# Patient Record
Sex: Female | Born: 1990 | Race: Black or African American | Hispanic: No | Marital: Single | Smoking: Current every day smoker
Health system: Southern US, Community
[De-identification: ages and names within clinical notes are randomized; demographics above are authoritative.]

## PROBLEM LIST (undated history)

## (undated) ENCOUNTER — Inpatient Hospital Stay (HOSPITAL_COMMUNITY): Payer: Self-pay

## (undated) ENCOUNTER — Inpatient Hospital Stay (HOSPITAL_COMMUNITY): Payer: No Typology Code available for payment source

## (undated) DIAGNOSIS — O139 Gestational [pregnancy-induced] hypertension without significant proteinuria, unspecified trimester: Secondary | ICD-10-CM

## (undated) HISTORY — PX: ESOPHAGOGASTRODUODENOSCOPY ENDOSCOPY: SHX5814

## (undated) HISTORY — PX: ABCESS DRAINAGE: SHX399

---

## 2013-03-07 ENCOUNTER — Emergency Department (HOSPITAL_COMMUNITY): Payer: No Typology Code available for payment source

## 2013-03-07 ENCOUNTER — Emergency Department (HOSPITAL_COMMUNITY)
Admission: EM | Admit: 2013-03-07 | Discharge: 2013-03-07 | Disposition: A | Discharge status: Home / Self Care | Payer: No Typology Code available for payment source | Attending: Emergency Medicine | Admitting: Emergency Medicine

## 2013-03-07 DIAGNOSIS — Z79899 Other long term (current) drug therapy: Secondary | ICD-10-CM | POA: Insufficient documentation

## 2013-03-07 DIAGNOSIS — S199XXA Unspecified injury of neck, initial encounter: Secondary | ICD-10-CM | POA: Insufficient documentation

## 2013-03-07 DIAGNOSIS — S0993XA Unspecified injury of face, initial encounter: Secondary | ICD-10-CM | POA: Insufficient documentation

## 2013-03-07 DIAGNOSIS — Y92410 Unspecified street and highway as the place of occurrence of the external cause: Secondary | ICD-10-CM | POA: Insufficient documentation

## 2013-03-07 DIAGNOSIS — Y9389 Activity, other specified: Secondary | ICD-10-CM | POA: Insufficient documentation

## 2013-03-07 DIAGNOSIS — M25519 Pain in unspecified shoulder: Secondary | ICD-10-CM | POA: Insufficient documentation

## 2013-03-07 MED ORDER — CYCLOBENZAPRINE HCL 10 MG PO TABS: 10.0000 mg | ORAL_TABLET | Freq: Two times a day (BID) | ORAL | Status: DC | PRN

## 2013-03-07 MED ORDER — OXYCODONE-ACETAMINOPHEN 5-325 MG PO TABS: 1.0000 | ORAL_TABLET | Freq: Four times a day (QID) | ORAL | Status: DC | PRN

## 2013-03-07 MED ORDER — OXYCODONE-ACETAMINOPHEN 5-325 MG PO TABS: 1.0000 | ORAL_TABLET | Freq: Once | ORAL | Status: DC

## 2013-03-07 MED ORDER — IBUPROFEN 800 MG PO TABS
800.0000 mg | ORAL_TABLET | Freq: Once | ORAL | Status: AC
  Administered 2013-03-07: 800 mg via ORAL
  Filled 2013-03-07: qty 1
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
## 2013-11-01 NOTE — L&D Delivery Note (Signed)
Delivery Note At 4:10 AM a viable female "Anita Strickland" was delivered via Vaginal, Spontaneous Delivery (Presentation: OA, restituting to Right Occiput Anterior).  APGARS: 8, 9; weight pending.   Placenta status: Intact, Spontaneous Pathology.  Cord: 3 vessels with the following complications: None.  Cord pH: NA --- placenta to path due to preeclampsia; thick cord.  Anesthesia: Epidural  Episiotomy: None Lacerations: Vaginal abrasion - hemostatic; no repair required Suture Repair: NA Est. Blood Loss (mL): 350  Mom to postpartum.  Baby to Couplet care / Skin to Skin.  Mom plans to breastfeeding.  Desires Nor-Q-D for contraception.  Couple plans outpatient circumcision.   Sherre ScarletWILLIAMS, Lashelle Koy 10/18/2014, 5:11 AM

## 2013-12-05 ENCOUNTER — Encounter (HOSPITAL_COMMUNITY): Payer: Self-pay | Admitting: Emergency Medicine

## 2013-12-05 ENCOUNTER — Emergency Department (HOSPITAL_COMMUNITY)
Admission: EM | Admit: 2013-12-05 | Discharge: 2013-12-05 | Disposition: A | Discharge status: Home / Self Care | Payer: 59 | Attending: Emergency Medicine | Admitting: Emergency Medicine

## 2013-12-05 DIAGNOSIS — F17200 Nicotine dependence, unspecified, uncomplicated: Secondary | ICD-10-CM | POA: Insufficient documentation

## 2013-12-05 DIAGNOSIS — R1310 Dysphagia, unspecified: Secondary | ICD-10-CM | POA: Insufficient documentation

## 2013-12-05 DIAGNOSIS — R599 Enlarged lymph nodes, unspecified: Secondary | ICD-10-CM | POA: Insufficient documentation

## 2013-12-05 DIAGNOSIS — Z79899 Other long term (current) drug therapy: Secondary | ICD-10-CM | POA: Insufficient documentation

## 2013-12-05 DIAGNOSIS — J029 Acute pharyngitis, unspecified: Secondary | ICD-10-CM | POA: Insufficient documentation

## 2013-12-05 DIAGNOSIS — R059 Cough, unspecified: Secondary | ICD-10-CM | POA: Insufficient documentation

## 2013-12-05 DIAGNOSIS — R05 Cough: Secondary | ICD-10-CM | POA: Insufficient documentation

## 2013-12-05 LAB — RAPID STREP SCREEN (MED CTR MEBANE ONLY): STREPTOCOCCUS, GROUP A SCREEN (DIRECT): NEGATIVE

## 2013-12-05 MED ORDER — IBUPROFEN 600 MG PO TABS: 600.0000 mg | ORAL_TABLET | Freq: Four times a day (QID) | ORAL | Status: DC | PRN

## 2013-12-05 MED ORDER — IBUPROFEN 400 MG PO TABS
600.0000 mg | ORAL_TABLET | Freq: Once | ORAL | Status: AC
  Administered 2013-12-05: 600 mg via ORAL
  Filled 2013-12-05 (×2): qty 1

## 2013-12-05 MED ORDER — DEXAMETHASONE 1 MG/ML PO CONC
10.0000 mg | Freq: Once | ORAL | Status: AC
  Administered 2013-12-05: 10 mg via ORAL
  Filled 2013-12-05: qty 10

## 2013-12-05 NOTE — ED Provider Notes (Signed)
CSN: [id number]     Arrival date & time 12/05/13  0503 History   First MD Initiated Contact with Patient 12/05/13 [phone]     Chief Complaint  Patient presents with  . Sore Throat   (Consider location/radiation/quality/duration/timing/severity/associated sxs/prior Treatment) HPI Comments: Pt comes in with cc of sore throat and occasional cough - non productive. Sx started on sunday. She has dysphagia and odynophagia. There is no n/v/f/c. Pt has no weakness. Decreased po intake due to the sore throat.  Patient is a 23 y.o. female presenting with pharyngitis. The history is provided by the patient.  Sore Throat Pertinent negatives include no chest pain and no shortness of breath.    History reviewed. No pertinent past medical history. History reviewed. No pertinent past surgical history. No family history on file. History  Substance Use Topics  . Smoking status: Current Every Day Smoker  . Smokeless tobacco: Not on file  . Alcohol Use: Yes   OB History   Grav Para Term Preterm Abortions TAB SAB Ect Mult Living                 Review of Systems  Constitutional: Negative for fever.  HENT: Positive for sore throat and trouble swallowing. Negative for drooling and voice change.   Respiratory: Positive for cough. Negative for shortness of breath, wheezing and stridor.   Cardiovascular: Negative for chest pain.    Allergies  Review of patient's allergies indicates no known allergies.  Home Medications   Current Outpatient Rx  Name  Route  Sig  Dispense  Refill  . Aspirin-Acetaminophen-Caffeine (GOODY HEADACHE PO)   Oral   Take 1 packet by mouth once.         . norethindrone-ethinyl estradiol (JUNEL FE,GILDESS FE,LOESTRIN FE) 1-20 MG-MCG tablet   Oral   Take 1 tablet by mouth daily.         . Pseudoephedrine-APAP-DM 30-325-15 MG CAPS   Oral   Take by mouth.         . Pseudoephedrine-APAP-DM 84-696-2930-325-15 MG/15ML LIQD   Oral   Take 15 mLs by mouth once.         Marland Kitchen.  ibuprofen (ADVIL,MOTRIN) 600 MG tablet   Oral   Take 1 tablet (600 mg total) by mouth every 6 (six) hours as needed.   30 tablet   0    BP 121/72  Pulse 110  Temp(Src) 99.1 F (37.3 C) (Oral)  Resp 16  SpO2 98%  LMP 12/02/2013 Physical Exam  Nursing note and vitals reviewed. Constitutional: She is oriented to person, place, and time. She appears well-developed and well-nourished.  HENT:  Head: Normocephalic and atraumatic.  Mouth/Throat: Oropharyngeal exudate present.  Right tonsillar exudates  Eyes: EOM are normal. Pupils are equal, round, and reactive to light.  Neck: Neck supple.  Cardiovascular: Normal rate, regular rhythm and normal heart sounds.   No murmur heard. Pulmonary/Chest: Effort normal. No stridor. No respiratory distress.  Abdominal: Soft.  Lymphadenopathy:    She has cervical adenopathy.  Neurological: She is alert and oriented to person, place, and time.  Skin: Skin is warm and dry.    ED Course  Procedures (including critical care time) Labs Review Labs Reviewed  RAPID STREP SCREEN  CULTURE, GROUP A STREP   Imaging Review No results found.  EKG Interpretation   None       MDM   1. Pharyngitis    Pt with sore throat and exudative changes on exam. Centor score is 2. Rapid  strep is neg - culture sent. Will treat as viral pharyngitis. Based on exam - no red flags for deep throat infections.  Derwood Kaplan, MD 12/05/13 [phone]

## 2013-12-05 NOTE — ED Notes (Signed)
Pt. reports sore throat with occasional dry cough onset Sunday , airway intact / respirations unlabored , denies fever .

## 2013-12-05 NOTE — Discharge Instructions (Signed)
Viral Pharyngitis Viral pharyngitis is a viral infection that produces redness, pain, and swelling (inflammation) of the throat. It can spread from person to person (contagious). CAUSES Viral pharyngitis is caused by inhaling a large amount of certain germs called viruses. Many different viruses cause viral pharyngitis. SYMPTOMS Symptoms of viral pharyngitis include:  Sore throat.  Tiredness.  Stuffy nose.  Low-grade fever.  Congestion.  Cough. TREATMENT Treatment includes rest, drinking plenty of fluids, and the use of over-the-counter medication (approved by your caregiver). HOME CARE INSTRUCTIONS   Drink enough fluids to keep your urine clear or pale yellow.  Eat soft, cold foods such as ice cream, frozen ice pops, or gelatin dessert.  Gargle with warm salt water (1 tsp salt per 1 qt of water).  If over age 7, throat lozenges may be used safely.  Only take over-the-counter or prescription medicines for pain, discomfort, or fever as directed by your caregiver. Do not take aspirin. To help prevent spreading viral pharyngitis to others, avoid:  Mouth-to-mouth contact with others.  Sharing utensils for eating and drinking.  Coughing around others. SEEK MEDICAL CARE IF:   You are better in a few days, then become worse.  You have a fever or pain not helped by pain medicines.  There are any other changes that concern you. Document Released: 07/28/2005 Document Revised: 01/10/2012 Document Reviewed: 12/24/2010 Exit[employer] Patient Information 2014 [employer].  

## 2013-12-07 LAB — CULTURE, GROUP A STREP

## 2014-02-26 ENCOUNTER — Other Ambulatory Visit (HOSPITAL_COMMUNITY)
Admission: RE | Admit: 2014-02-26 | Discharge: 2014-02-26 | Disposition: A | Discharge status: Home / Self Care | Payer: Medicaid Other | Source: Ambulatory Visit | Attending: Emergency Medicine | Admitting: Emergency Medicine

## 2014-02-26 ENCOUNTER — Encounter (HOSPITAL_COMMUNITY): Payer: Self-pay | Admitting: Emergency Medicine

## 2014-02-26 ENCOUNTER — Emergency Department (INDEPENDENT_AMBULATORY_CARE_PROVIDER_SITE_OTHER)
Admission: EM | Admit: 2014-02-26 | Discharge: 2014-02-26 | Disposition: A | Discharge status: Home / Self Care | Payer: BC Managed Care – PPO | Source: Home / Self Care | Attending: Family Medicine | Admitting: Family Medicine

## 2014-02-26 DIAGNOSIS — N760 Acute vaginitis: Secondary | ICD-10-CM | POA: Insufficient documentation

## 2014-02-26 DIAGNOSIS — Z3490 Encounter for supervision of normal pregnancy, unspecified, unspecified trimester: Secondary | ICD-10-CM

## 2014-02-26 DIAGNOSIS — Z3201 Encounter for pregnancy test, result positive: Secondary | ICD-10-CM

## 2014-02-26 DIAGNOSIS — Z113 Encounter for screening for infections with a predominantly sexual mode of transmission: Secondary | ICD-10-CM | POA: Insufficient documentation

## 2014-02-26 LAB — POCT URINALYSIS DIP (DEVICE)
BILIRUBIN URINE: NEGATIVE
Glucose, UA: NEGATIVE mg/dL
Hgb urine dipstick: NEGATIVE
KETONES UR: NEGATIVE mg/dL
Leukocytes, UA: NEGATIVE
Nitrite: NEGATIVE
PH: 7.5 (ref 5.0–8.0)
PROTEIN: NEGATIVE mg/dL
SPECIFIC GRAVITY, URINE: 1.02 (ref 1.005–1.030)
Urobilinogen, UA: 0.2 mg/dL (ref 0.0–1.0)

## 2014-02-26 LAB — POCT PREGNANCY, URINE: Preg Test, Ur: POSITIVE — AB

## 2014-02-26 MED ORDER — PRENATAL COMPLETE 14-0.4 MG PO TABS: ORAL_TABLET | ORAL | Status: DC

## 2014-02-26 NOTE — ED Notes (Signed)
Pt here for pregnancy testing.  Unsure of last LMP.   States "I had three positive home pregnancy test".  Pt voices no other concerns at this time.

## 2014-02-26 NOTE — Discharge Instructions (Signed)

## 2014-02-26 NOTE — ED Provider Notes (Signed)
CSN: [id number]     Arrival date & time 02/26/14  1116 History   First MD Initiated Contact with Patient 02/26/14 1219     Chief Complaint  Patient presents with  . Possible Pregnancy   (Consider location/radiation/quality/duration/timing/severity/associated sxs/prior Treatment) Patient is a 23 y.o. female presenting with vaginal discharge. The history is provided by the patient. No language interpreter was used.  Vaginal Discharge Quality:  White Severity:  Mild Onset quality:  Gradual Timing:  Constant Progression:  Worsening Chronicity:  New Relieved by:  Nothing Worsened by:  Nothing tried Ineffective treatments:  None tried Associated symptoms: no nausea     History reviewed. No pertinent past medical history. History reviewed. No pertinent past surgical history. History reviewed. No pertinent family history. History  Substance Use Topics  . Smoking status: Current Every Day Smoker  . Smokeless tobacco: Not on file  . Alcohol Use: Yes   OB History   Grav Para Term Preterm Abortions TAB SAB Ect Mult Living   1              Review of Systems  Gastrointestinal: Negative for nausea.  Genitourinary: Positive for vaginal discharge.  All other systems reviewed and are negative.   Allergies  Review of patient's allergies indicates no known allergies.  Home Medications   Prior to Admission medications   Medication Sig Start Date End Date Taking? Authorizing Provider  ibuprofen (ADVIL,MOTRIN) 600 MG tablet Take 1 tablet (600 mg total) by mouth every 6 (six) hours as needed. 12/05/13  Yes Derwood KaplanAnkit Nanavati, MD  Aspirin-Acetaminophen-Caffeine (GOODY HEADACHE PO) Take 1 packet by mouth once.    Historical Provider, MD  norethindrone-ethinyl estradiol (JUNEL FE,GILDESS FE,LOESTRIN FE) 1-20 MG-MCG tablet Take 1 tablet by mouth daily.    Historical Provider, MD  Pseudoephedrine-APAP-DM 82-956-2130-325-15 MG CAPS Take by mouth.    Historical Provider, MD  Pseudoephedrine-APAP-DM 30-865-7830-325-15  MG/15ML LIQD Take 15 mLs by mouth once.    Historical Provider, MD   BP 120/74  Pulse 64  Temp(Src) 98.4 F (36.9 C) (Oral)  Resp 14  SpO2 100%  LMP 12/02/2013 Physical Exam  Nursing note and vitals reviewed. Constitutional: She appears well-developed and well-nourished.  HENT:  Head: Normocephalic and atraumatic.  Right Ear: External ear normal.  Left Ear: External ear normal.  Eyes: Pupils are equal, round, and reactive to light.  Neck: Normal range of motion.  Cardiovascular: Normal rate.   Pulmonary/Chest: Effort normal and breath sounds normal.  Abdominal: Soft.  Genitourinary: Vaginal discharge found.  Musculoskeletal: Normal range of motion.  Neurological: She is alert.  Skin: Skin is warm.  Psychiatric: She has a normal mood and affect.    ED Course  Procedures (including critical care time) Labs Review Labs Reviewed - No data to display  Imaging Review No results found.   MDM   1. Pregnancy    Affirm, gc and ct pending    Elson AreasLeslie K Viviann Broyles, PA-C 02/26/14 1748

## 2014-02-27 LAB — CERVICOVAGINAL ANCILLARY ONLY
Chlamydia: NEGATIVE
Neisseria Gonorrhea: NEGATIVE
WET PREP (BD AFFIRM): NEGATIVE
Wet Prep (BD Affirm): NEGATIVE
Wet Prep (BD Affirm): POSITIVE — AB

## 2014-02-27 NOTE — ED Provider Notes (Signed)
Medical screening examination/treatment/procedure(s) were performed by resident physician or non-physician practitioner and as supervising physician I was immediately available for consultation/collaboration.   Dillen Belmontes DOUGLAS MD.   Yeimy Brabant D Evangeline Utley, MD 02/27/14 1705 

## 2014-03-01 ENCOUNTER — Telehealth (HOSPITAL_COMMUNITY): Payer: Self-pay | Admitting: *Deleted

## 2014-03-01 LAB — OB RESULTS CONSOLE GC/CHLAMYDIA
CHLAMYDIA, DNA PROBE: NEGATIVE
Gonorrhea: NEGATIVE

## 2014-03-01 LAB — OB RESULTS CONSOLE HEPATITIS B SURFACE ANTIGEN: HEP B S AG: NEGATIVE

## 2014-03-01 LAB — OB RESULTS CONSOLE HIV ANTIBODY (ROUTINE TESTING): HIV: NONREACTIVE

## 2014-03-01 LAB — OB RESULTS CONSOLE RPR: RPR: NONREACTIVE

## 2014-03-01 LAB — OB RESULTS CONSOLE ABO/RH: RH Type: POSITIVE

## 2014-03-01 LAB — OB RESULTS CONSOLE RUBELLA ANTIBODY, IGM: Rubella: IMMUNE

## 2014-03-01 LAB — OB RESULTS CONSOLE ANTIBODY SCREEN: ANTIBODY SCREEN: NEGATIVE

## 2014-03-01 NOTE — ED Notes (Signed)
GC/Chlamydia neg., Affirm: Candida and Trich neg., Gardnerella pos.  4/30 Message sent to NCR CorporationKaren Sofia PA.  She ordered Flagyl 500 mg. Po BID # 14.  5/1 I called pt. Pt. verified x 2 and given results.  Pt. told she needs Flagyl for bacterial vaginosis.  Pt. asked if it is safe to take in pregnancy.  I told her she can check with OB-GYN before taking, but I have been told by the physicians here that it is OK to take during pregnancy.  Pt. wants Rx. called to [facility].  Rx. Called to pharmacy voicemail @ [phone]. Desiree LucySuzanne M Eiley Mcginnity 03/01/2014

## 2014-08-25 ENCOUNTER — Encounter (HOSPITAL_COMMUNITY): Payer: Self-pay | Admitting: *Deleted

## 2014-08-25 ENCOUNTER — Inpatient Hospital Stay (HOSPITAL_COMMUNITY)
Admission: AD | Admit: 2014-08-25 | Discharge: 2014-08-25 | Disposition: A | Discharge status: Home / Self Care | Payer: Medicaid Other | Source: Ambulatory Visit | Attending: Obstetrics & Gynecology | Admitting: Obstetrics & Gynecology

## 2014-08-25 DIAGNOSIS — Z720 Tobacco use: Secondary | ICD-10-CM | POA: Insufficient documentation

## 2014-08-25 DIAGNOSIS — O0932 Supervision of pregnancy with insufficient antenatal care, second trimester: Secondary | ICD-10-CM

## 2014-08-25 DIAGNOSIS — N898 Other specified noninflammatory disorders of vagina: Secondary | ICD-10-CM | POA: Diagnosis not present

## 2014-08-25 DIAGNOSIS — O26893 Other specified pregnancy related conditions, third trimester: Secondary | ICD-10-CM | POA: Insufficient documentation

## 2014-08-25 DIAGNOSIS — Z3A29 29 weeks gestation of pregnancy: Secondary | ICD-10-CM | POA: Diagnosis not present

## 2014-08-25 DIAGNOSIS — R109 Unspecified abdominal pain: Secondary | ICD-10-CM | POA: Diagnosis not present

## 2014-08-25 LAB — WET PREP, GENITAL
CLUE CELLS WET PREP: NONE SEEN
Trich, Wet Prep: NONE SEEN
YEAST WET PREP: NONE SEEN

## 2014-08-25 LAB — OB RESULTS CONSOLE ABO/RH: RH TYPE: POSITIVE

## 2014-08-25 LAB — URINALYSIS, ROUTINE W REFLEX MICROSCOPIC
Bilirubin Urine: NEGATIVE
GLUCOSE, UA: NEGATIVE mg/dL
Hgb urine dipstick: NEGATIVE
KETONES UR: NEGATIVE mg/dL
LEUKOCYTES UA: NEGATIVE
NITRITE: NEGATIVE
PH: 7 (ref 5.0–8.0)
PROTEIN: NEGATIVE mg/dL
Specific Gravity, Urine: 1.01 (ref 1.005–1.030)
Urobilinogen, UA: 0.2 mg/dL (ref 0.0–1.0)

## 2014-08-25 MED ORDER — FLUCONAZOLE 150 MG PO TABS: 150.0000 mg | ORAL_TABLET | Freq: Once | ORAL | Status: DC

## 2014-08-25 NOTE — Treatment Plan (Signed)
[facility].  Pt is O+.

## 2014-08-25 NOTE — MAU Provider Note (Signed)
Chief Complaint:  Abdominal Pain   Anita Strickland is a 23 y.o.  G2P0010 with IUP w limited pnc presenting for Abdominal Pain . She has not been seen by a doctor since 07/10/2014. Patient states she has been having no contractions, no vaginal bleeding, intact membranes, with active fetal movement.   She states that abdominal pain is like cramping, irregular, and has been happening for the past few days. She states that she rarely drinks water. Last intercourse was last night, denies dyspareunia. C/o vaginal discharge for the past week that at first was thin and white but now is thicker and pruritic.  Menstrual History: OB History   Grav Para Term Preterm Abortions TAB SAB Ect Mult Living   2 0 0 0 1 0 1 0 0 0      Patient's last menstrual period was 12/02/2013.      Past Medical History  Diagnosis Date  . Medical history non-contributory     Past Surgical History  Procedure Laterality Date  . Abcess drainage      leg    No family history on file.  History  Substance Use Topics  . Smoking status: Current Every Day Smoker    Types: Cigarettes  . Smokeless tobacco: Never Used  . Alcohol Use: Yes     No Known Allergies  Prescriptions prior to admission  Medication Sig Dispense Refill  . Aspirin-Acetaminophen-Caffeine (GOODY HEADACHE PO) Take 1 packet by mouth once.      Marland Kitchen. ibuprofen (ADVIL,MOTRIN) 600 MG tablet Take 1 tablet (600 mg total) by mouth every 6 (six) hours as needed.  30 tablet  0  . norethindrone-ethinyl estradiol (JUNEL FE,GILDESS FE,LOESTRIN FE) 1-20 MG-MCG tablet Take 1 tablet by mouth daily.      . Prenatal Vit-Fe Fumarate-FA (PRENATAL COMPLETE) 14-0.4 MG TABS One a day.  (may substitute, brand or dosage)  60 each  1  . Pseudoephedrine-APAP-DM 30-325-15 MG CAPS Take by mouth.      . Pseudoephedrine-APAP-DM 30-325-15 MG/15ML LIQD Take 15 mLs by mouth once.        Review of Systems - Negative except for what is mentioned in HPI.  Physical Exam  Blood  pressure 119/73, pulse 83, temperature 98.5 F (36.9 C), temperature source Oral, resp. rate 18, height 5' (1.524 m), weight 78.019 kg (172 lb), last menstrual period 12/02/2013. GENERAL: Well-developed, well-nourished female in no acute distress.  LUNGS: Clear to auscultation bilaterally.  HEART: Regular rate and rhythm. ABDOMEN: Soft, nontender, nondistended, gravid.  EXTREMITIES: Nontender, no edema, 2+ distal pulses.   Cervical Exam: Closed/thick/high FHT:  Baseline rate 140 bpm   Variability moderate  Accelerations present   Decelerations none Slight uterine excitabilty   Labs: Results for orders placed during the hospital encounter of 08/25/14 (from the past 24 hour(s))  URINALYSIS, ROUTINE W REFLEX MICROSCOPIC   Collection Time    08/25/14  2:15 PM      Result Value Ref Range   Color, Urine YELLOW  YELLOW   APPearance CLEAR  CLEAR   Specific Gravity, Urine 1.010  1.005 - 1.030   pH 7.0  5.0 - 8.0   Glucose, UA NEGATIVE  NEGATIVE mg/dL   Hgb urine dipstick NEGATIVE  NEGATIVE   Bilirubin Urine NEGATIVE  NEGATIVE   Ketones, ur NEGATIVE  NEGATIVE mg/dL   Protein, ur NEGATIVE  NEGATIVE mg/dL   Urobilinogen, UA 0.2  0.0 - 1.0 mg/dL   Nitrite NEGATIVE  NEGATIVE   Leukocytes, UA NEGATIVE  NEGATIVE  Imaging Studies:  No results found.  Assessment: Anita Strickland is  23 y.o. G2P0010 at Unknown presents with Abdominal Pain .  Plan:  #Gestation of [id number] w limited PNC --No signs of active/imminent labor. --Pt has very limited PNC; will send notification to [facility] to make appt and contact her --Pt is O positive; no indication for Rho gam at this time  #Vaginal discharge --Most likely candidal --Diflucan dose today; will prescribe 2 more tablets to take q3d as needed for symptoms  #FWB --NST reactive. Category I. --Increased uterine tonicity. Explained to pt importance of hydration during pregnancy --No current indication for tocolysis  Aldona BarKoch, Anessia Oakland L 10/25/20153:27  PM
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
## 2014-08-25 NOTE — MAU Provider Note (Signed)
Attestation of Attending Supervision of Resident: Evaluation and management procedures were performed by the Family Medicine Resident under my supervision.  I have seen and examined the patient, reviewed the resident's note and chart, and I agree with the management and plan.  Ameyah Bangura, MD, FACOG Attending Obstetrician & Gynecologist Faculty Practice, [facility]    

## 2014-08-26 LAB — GC/CHLAMYDIA PROBE AMP
CT Probe RNA: NEGATIVE
GC Probe RNA: NEGATIVE

## 2014-09-02 ENCOUNTER — Encounter (HOSPITAL_COMMUNITY): Payer: Self-pay | Admitting: *Deleted

## 2014-10-08 LAB — OB RESULTS CONSOLE GBS: STREP GROUP B AG: POSITIVE

## 2014-10-09 ENCOUNTER — Encounter (HOSPITAL_COMMUNITY): Payer: Self-pay | Admitting: *Deleted

## 2014-10-09 ENCOUNTER — Encounter: Payer: Self-pay | Admitting: Advanced Practice Midwife

## 2014-10-09 ENCOUNTER — Inpatient Hospital Stay (HOSPITAL_COMMUNITY)
Admission: AD | Admit: 2014-10-09 | Discharge: 2014-10-09 | Disposition: A | Discharge status: Home / Self Care | Payer: Medicaid Other | Source: Ambulatory Visit | Attending: Obstetrics & Gynecology | Admitting: Obstetrics & Gynecology

## 2014-10-09 ENCOUNTER — Encounter: Payer: Medicaid Other | Admitting: Advanced Practice Midwife

## 2014-10-09 ENCOUNTER — Ambulatory Visit: Payer: Medicaid Other | Admitting: Family Medicine

## 2014-10-09 DIAGNOSIS — I159 Secondary hypertension, unspecified: Secondary | ICD-10-CM

## 2014-10-09 LAB — URINALYSIS, ROUTINE W REFLEX MICROSCOPIC
BILIRUBIN URINE: NEGATIVE
Glucose, UA: NEGATIVE mg/dL
Hgb urine dipstick: NEGATIVE
Ketones, ur: NEGATIVE mg/dL
Leukocytes, UA: NEGATIVE
NITRITE: NEGATIVE
PH: 7 (ref 5.0–8.0)
Protein, ur: 100 mg/dL — AB
SPECIFIC GRAVITY, URINE: 1.025 (ref 1.005–1.030)
UROBILINOGEN UA: 1 mg/dL (ref 0.0–1.0)

## 2014-10-09 LAB — URINE MICROSCOPIC-ADD ON

## 2014-10-09 NOTE — MAU Note (Signed)
Pt was called my MD office & told to come to hospital for preeclampsia.  BP was up at office yesterday.  Has slight HA today, denies visual changes or epigastric pain.  Denies uc's, bleeding or LOF.

## 2014-10-09 NOTE — MAU Provider Note (Signed)
MAU Addendum Note  Results for orders placed or performed during the hospital encounter of 10/09/14 (from the past 24 hour(s))  Urinalysis, Routine w reflex microscopic     Status: Abnormal   Collection Time: 10/09/14  1:12 PM  Result Value Ref Range   Color, Urine YELLOW YELLOW   APPearance CLEAR CLEAR   Specific Gravity, Urine 1.025 1.005 - 1.030   pH 7.0 5.0 - 8.0   Glucose, UA NEGATIVE NEGATIVE mg/dL   Hgb urine dipstick NEGATIVE NEGATIVE   Bilirubin Urine NEGATIVE NEGATIVE   Ketones, ur NEGATIVE NEGATIVE mg/dL   Protein, ur 161100 (A) NEGATIVE mg/dL   Urobilinogen, UA 1.0 0.0 - 1.0 mg/dL   Nitrite NEGATIVE NEGATIVE   Leukocytes, UA NEGATIVE NEGATIVE  Urine microscopic-add on     Status: Abnormal   Collection Time: 10/09/14  1:12 PM  Result Value Ref Range   Squamous Epithelial / LPF FEW (A) RARE   WBC, UA 3-6 <3 WBC/hpf   RBC / HPF 0-2 <3 RBC/hpf   Bacteria, UA MANY (A) RARE   Urine-Other MUCOUS PRESENT    Filed Vitals:   10/09/14 1307 10/09/14 1317 10/09/14 1327 10/09/14 1337  BP: 148/102 155/107 145/106 147/103  Pulse: 70 81 74 80  Temp:      TempSrc:      Resp:       Pt remains asymptomatic  FHT 130, + accel, no decel, moderate variability Ctx q2-8    Plan: -monitor BP for 1 more  Hours then DC to home if BPs are not severe (>160/110) -Discussed need to follow up in office on Monday, pt to make an appointment -Bleeding and labor Precautions -Encouraged to call if any questions or concerns arise prior to next scheduled office visit.  -Discharged to home in stable condition -IOL at 37.0 weeks, pt will be call with time -Consulted with Dr. Burna FortsKulwa   Jaunita Mikels, CNM, MSN 10/09/2014. 2:39 PM

## 2014-10-09 NOTE — MAU Provider Note (Signed)
Anita HookBreanna Strickland is a 23 y.o. G2P0010 at 36.0 weeks came from the office d/t concerns of elevated BP.  Here for serial BP.  As per Dr Sallye OberKulwa monitor for severe BP and s/s of pre x.     History     There are no active problems to display for this patient.   Chief Complaint  Patient presents with  . Hypertension   HPI  OB History    Gravida Para Term Preterm AB TAB SAB Ectopic Multiple Living   2 0 0 0 1 0 1 0 0 0       Past Medical History  Diagnosis Date  . Medical history non-contributory     Past Surgical History  Procedure Laterality Date  . Abcess drainage      leg    History reviewed. No pertinent family history.  History  Substance Use Topics  . Smoking status: Current Every Day Smoker -- 0.25 packs/day    Types: Cigarettes  . Smokeless tobacco: Never Used  . Alcohol Use: Yes    Allergies: No Known Allergies  Prescriptions prior to admission  Medication Sig Dispense Refill Last Dose  . omeprazole (PRILOSEC OTC) 20 MG tablet Take 20 mg by mouth daily as needed (for heartburn).    Past Week at Unknown time  . Prenatal Vit-Fe Fumarate-FA (PRENATAL MULTIVITAMIN) TABS tablet Take 1 tablet by mouth daily.   Past Week at Unknown time  . acetaminophen (TYLENOL) 500 MG tablet Take 1,000 mg by mouth every 6 (six) hours as needed for headache.   prn  . fluconazole (DIFLUCAN) 150 MG tablet Take 1 tablet (150 mg total) by mouth once. (Patient not taking: Reported on 10/09/2014) 3 tablet 0     ROS See HPI above, all other systems are negative  Physical Exam   Blood pressure 147/105, pulse 80, temperature 98.1 F (36.7 C), temperature source Oral, resp. rate 18, last menstrual period 12/02/2013.  Physical Exam Ext:  WNL ABD: Soft, non tender to palpation, no rebound or guarding SVE: deferred   ED Course  Assessment: IUP at  36.0 weeks Membranes: intact FHR: Category 1, FHR 130, 10 x 10', moderate variability, no decel, CTX:  Occasional, not felt by  pt   Plan: Serial BP x 2 hours, monitor for severely elevated BP and s/s of pre x OK to Dc if no severe features Consult with Dr. Burna Fortskulwa   Geraldine Sandberg, CNM, MSN 10/09/2014. 12:58 PM

## 2014-10-09 NOTE — Discharge Instructions (Signed)

## 2014-10-09 NOTE — Progress Notes (Signed)
Anita BakeV. Standard, CNM reviewed BP's and tracing. Patient given written handout on preeclampsia with D/C instrs.

## 2014-10-10 ENCOUNTER — Encounter (HOSPITAL_COMMUNITY): Payer: Self-pay | Admitting: *Deleted

## 2014-10-10 ENCOUNTER — Observation Stay (HOSPITAL_COMMUNITY)
Admission: AD | Admit: 2014-10-10 | Discharge: 2014-10-11 | Disposition: A | Discharge status: Home / Self Care | Payer: Medicaid Other | Source: Ambulatory Visit | Attending: Obstetrics and Gynecology | Admitting: Obstetrics and Gynecology

## 2014-10-10 DIAGNOSIS — F17200 Nicotine dependence, unspecified, uncomplicated: Secondary | ICD-10-CM

## 2014-10-10 DIAGNOSIS — O1493 Unspecified pre-eclampsia, third trimester: Secondary | ICD-10-CM | POA: Diagnosis present

## 2014-10-10 DIAGNOSIS — O99333 Smoking (tobacco) complicating pregnancy, third trimester: Secondary | ICD-10-CM | POA: Diagnosis not present

## 2014-10-10 DIAGNOSIS — B951 Streptococcus, group B, as the cause of diseases classified elsewhere: Secondary | ICD-10-CM

## 2014-10-10 DIAGNOSIS — Z3A36 36 weeks gestation of pregnancy: Secondary | ICD-10-CM | POA: Diagnosis not present

## 2014-10-10 LAB — CBC
HCT: 37.8 % (ref 36.0–46.0)
Hemoglobin: 13.1 g/dL (ref 12.0–15.0)
MCH: 29.8 pg (ref 26.0–34.0)
MCHC: 34.7 g/dL (ref 30.0–36.0)
MCV: 85.9 fL (ref 78.0–100.0)
PLATELETS: 259 10*3/uL (ref 150–400)
RBC: 4.4 MIL/uL (ref 3.87–5.11)
RDW: 14.8 % (ref 11.5–15.5)
WBC: 8.9 10*3/uL (ref 4.0–10.5)

## 2014-10-10 LAB — COMPREHENSIVE METABOLIC PANEL
ALT: 8 U/L (ref 0–35)
AST: 12 U/L (ref 0–37)
Albumin: 2.5 g/dL — ABNORMAL LOW (ref 3.5–5.2)
Alkaline Phosphatase: 181 U/L — ABNORMAL HIGH (ref 39–117)
Anion gap: 13 (ref 5–15)
BILIRUBIN TOTAL: 0.4 mg/dL (ref 0.3–1.2)
BUN: 5 mg/dL — ABNORMAL LOW (ref 6–23)
CHLORIDE: 101 meq/L (ref 96–112)
CO2: 21 meq/L (ref 19–32)
Calcium: 9.2 mg/dL (ref 8.4–10.5)
Creatinine, Ser: 0.71 mg/dL (ref 0.50–1.10)
GFR calc Af Amer: >90 mL/min (ref >90)
Glucose, Bld: 120 mg/dL — ABNORMAL HIGH (ref 70–99)
POTASSIUM: 3.6 meq/L — AB (ref 3.7–5.3)
SODIUM: 135 meq/L — AB (ref 137–147)
Total Protein: 6.3 g/dL (ref 6.0–8.3)

## 2014-10-10 LAB — PROTEIN / CREATININE RATIO, URINE
Creatinine, Urine: 230.57 mg/dL
PROTEIN CREATININE RATIO: 1.88 — AB (ref 0.00–0.15)
TOTAL PROTEIN, URINE: 433.7 mg/dL

## 2014-10-10 LAB — LACTATE DEHYDROGENASE: LDH: 163 U/L (ref 94–250)

## 2014-10-10 LAB — URIC ACID: URIC ACID, SERUM: 5.4 mg/dL (ref 2.4–7.0)

## 2014-10-10 MED ORDER — CALCIUM CARBONATE ANTACID 500 MG PO CHEW
2.0000 | CHEWABLE_TABLET | ORAL | Status: DC | PRN
  Administered 2014-10-11: 400 mg via ORAL
  Filled 2014-10-10: qty 1
  Filled 2014-10-10 (×2): qty 2

## 2014-10-10 MED ORDER — PRENATAL MULTIVITAMIN CH
1.0000 | ORAL_TABLET | Freq: Every day | ORAL | Status: DC
  Administered 2014-10-11: 1 via ORAL
  Filled 2014-10-10 (×3): qty 1

## 2014-10-10 MED ORDER — SODIUM CHLORIDE 0.9 % IV SOLN: 250.0000 mL | INTRAVENOUS | Status: DC | PRN

## 2014-10-10 MED ORDER — LABETALOL HCL 5 MG/ML IV SOLN: 10.0000 mg | INTRAVENOUS | Status: DC | PRN

## 2014-10-10 MED ORDER — DOCUSATE SODIUM 100 MG PO CAPS
100.0000 mg | ORAL_CAPSULE | Freq: Every day | ORAL | Status: DC
  Filled 2014-10-10 (×2): qty 1

## 2014-10-10 MED ORDER — SODIUM CHLORIDE 0.9 % IJ SOLN: 3.0000 mL | INTRAMUSCULAR | Status: DC | PRN

## 2014-10-10 MED ORDER — SODIUM CHLORIDE 0.9 % IJ SOLN
3.0000 mL | Freq: Two times a day (BID) | INTRAMUSCULAR | Status: DC
  Administered 2014-10-10: 3 mL via INTRAVENOUS

## 2014-10-10 MED ORDER — ZOLPIDEM TARTRATE 5 MG PO TABS
5.0000 mg | ORAL_TABLET | Freq: Every evening | ORAL | Status: DC | PRN
  Administered 2014-10-10: 5 mg via ORAL
  Filled 2014-10-10: qty 1

## 2014-10-10 MED ORDER — ACETAMINOPHEN 325 MG PO TABS: 650.0000 mg | ORAL_TABLET | ORAL | Status: DC | PRN

## 2014-10-10 NOTE — Progress Notes (Signed)
S: Requests to take a shower. Denies h/a, visual disturbances, RUQ pain, N/V, SOB, CP, weakness, LOF, ctxs or VB. Reports active fetus.  O:  Filed Vitals:   10/10/14 2250  BP: 149/95  Pulse: 79  Temp:   Resp: 18  Has not required IV Labetalol since admission. Lungs: CTAB CV: RRR Abdomen: gravid, soft, NT Pelvic: Deferred (was closed/50%/-2 on admission) FHRT: BL 125 w/ moderate variability, +accels, no decels Ctxs: 1-5 min, palpate mild, +uterine irritability Ext: 2+ DTRs, no clonus, 2+ pitting edema to LE  A: 23 yo G2P0010 @ 36.1 wks, admitted in obs status due to preeclampsia. Cat 1 FHRT  P: May shower with strict supervision. Continue with previous plan.   Sherre ScarletKimberly Vincy Feliz, CNM 10/10/14, 11:06 PM

## 2014-10-10 NOTE — MAU Note (Signed)
Dx yesterday with pre-eclampsia. Was discharged, but s&s reviewed to call/return if things changed. 159/110 today when checked- called dr and was told to come in. Has a HA,swelling is about the same; denies visual changes and epigastric pain.

## 2014-10-10 NOTE — H&P (Signed)
Anita Strickland is a 23 y.o. female, G2P0010 at 1336 1/7 weeks, presenting for admission due to mild pre-eclampsia.  She was seen in the office on 12/8 for a routine visit--BP was 120/90, 146/90.  PIH labs were WNL, with PCR 2.68.  She was seen at MAU 12/9 for BP evaluation, with range 145-155/102-107.  No severe features were noted, and the patient [facility] and the anticipated plan for induction at 37 weeks, scheduled for 10/16/14 at 7:30pm.  She had a mild HA today and went to local drug store for a machine BP check--159/110.  She called the office and was directed to MAU.  She denies any severe HA, visual sx, or epigastric pain.  Denies any contractions, reports +FM.    Evaluated in MAU with labs, NST, cervical exam, PCR.   Patient Active Problem List   Diagnosis Date Noted  . Pre-eclampsia 10/10/2014  . Positive GBS test--10/08/14 10/10/2014  . Smoker 10/10/2014    History of present pregnancy: Patient entered care at 6 6/7 weeks, with US then for dating/viability. EDC of 11/06/14  Was established by US at 6 6/7 weeks.  Followed at [facility] until after visit at 14 weeks--she then moved to [place] until returning to [facility] at 31 6/7 weeks. She had not had a visit with any provider since 19 weeks upon her return to [facility].  Anatomy scan:  19 weeks, with normal findings per record. Additional US evaluations:  31 6/7 weeks:  EFW 4+1, 36%ile, AFI 14.72, 50%ile, BPP 8/8, vtx. 35 6/7 weeks for S<D:   EFW 5+15, 15%ile, BPD 7%ile. AFI 12.  BPP 8/8.  Significant prenatal events:  [facility], moved to [place] after 14 week visit.  [facility] at 31 6/7 weeks, with a lapse in care since 19 week US in [place].  Noted to have sciatica.  Tinea cruris, also treated for vaginal yeast.  BP 98/70 upon return to [facility], then 120/80 until visit at 32 6/7 weeks, when BP elevated.  PIH labs 12/8 were WNL, but PCR was 2.68. Last evaluation:  10/09/14 in MAU.  OB  History    Gravida Para Term Preterm AB TAB SAB Ectopic Multiple Living   2 0 0 0 1 0 1 0 0 0     ? Year:  SAB  Past Medical History  Diagnosis Date  . Medical history non-contributory    Past Surgical History  Procedure Laterality Date  . Abcess drainage      leg  . Esophagogastroduodenoscopy endoscopy     Family History: Brother with asthma; MGM diabetes.  Social History:  reports that she has been smoking Cigarettes.  She has been smoking about 0.10 packs per day. She has never used smokeless tobacco. She reports that she drinks alcohol. She reports that she does not use illicit drugs.  Patient is African American, of the [place] faith, has 2 years of college, employed in a factory.  She is single, but FOB is involved and supportive.   Prenatal Transfer Tool  Maternal Diabetes: No Genetic Screening: Declined Maternal Ultrasounds/Referrals: Abnormal:  Findings:   IUGR--growth at 15%ile, BPD at 7%ile Fetal Ultrasounds or other Referrals:  None Maternal Substance Abuse:  Yes:  Type: Smoker Significant Maternal Medications:  None Significant Maternal Lab Results: Lab values include: Group B Strep positive    ROS:  Very mild HA, occasional cramping, + FM, swelling of feet/legs  No Known Allergies   Dilation: Closed Effacement (%):  50 Station: -2 Exam by:: Manfred ArchV. Birtha Hatler, CNM Blood pressure 155/95, pulse 71, temperature 98.8 F (37.1 C), temperature source Oral, resp. rate 18, height 5' (1.524 m), weight 182 lb (82.555 kg), last menstrual period 12/02/2013.  Chest clear Heart RRR without murmur Abd gravid, NT, FH 35 Pelvic: As above, cervix posterior Ext: DTR 2+, no clonus, +2 edema in LE  FHR: Category 1 UCs:  Frequent, mild.  Prenatal labs: ABO, Rh: O/Positive/-- (10/25 0000) Antibody:  Neg Rubella:   Immune RPR:   Neg HBsAg:   Neg HIV:   NR GBS:  Positive 10/08/14 Sickle cell/Hgb electrophoresis:  AA Pap:  04/11/14 WNL GC:  Negative 03/01/14, 10/08/14 Chlamydia:  Negative 03/01/14, 10/08/14 Genetic screenings:  Declined Glucola:  WNL Other:  Hgb 12.9 at NOB, 12.4 at 34 weeks    Results for orders placed or performed during the hospital encounter of 10/10/14 (from the past 24 hour(s))  CBC     Status: None   Collection Time: 10/10/14  4:07 PM  Result Value Ref Range   WBC 8.9 4.0 - 10.5 K/uL   RBC 4.40 3.87 - 5.11 MIL/uL   Hemoglobin 13.1 12.0 - 15.0 g/dL   HCT 29.537.8 62.136.0 - 30.846.0 %   MCV 85.9 78.0 - 100.0 fL   MCH 29.8 26.0 - 34.0 pg   MCHC 34.7 30.0 - 36.0 g/dL   RDW 65.714.8 84.611.5 - 96.215.5 %   Platelets 259 150 - 400 K/uL  Comprehensive metabolic panel     Status: Abnormal   Collection Time: 10/10/14  4:07 PM  Result Value Ref Range   Sodium 135 (L) 137 - 147 mEq/L   Potassium 3.6 (L) 3.7 - 5.3 mEq/L   Chloride 101 96 - 112 mEq/L   CO2 21 19 - 32 mEq/L   Glucose, Bld 120 (H) 70 - 99 mg/dL   BUN 5 (L) 6 - 23 mg/dL   Creatinine, Ser 9.520.71 0.50 - 1.10 mg/dL   Calcium 9.2 8.4 - 84.110.5 mg/dL   Total Protein 6.3 6.0 - 8.3 g/dL   Albumin 2.5 (L) 3.5 - 5.2 g/dL   AST 12 0 - 37 U/L   ALT 8 0 - 35 U/L   Alkaline Phosphatase 181 (H) 39 - 117 U/L   Total Bilirubin 0.4 0.3 - 1.2 mg/dL   GFR calc non Af Amer >90 >90 mL/min   GFR calc Af Amer >90 >90 mL/min   Anion gap 13 5 - 15  Uric acid     Status: None   Collection Time: 10/10/14  4:07 PM  Result Value Ref Range   Uric Acid, Serum 5.4 2.4 - 7.0 mg/dL  Lactate dehydrogenase     Status: None   Collection Time: 10/10/14  4:07 PM  Result Value Ref Range   LDH 163 94 - 250 U/L  Protein / creatinine ratio, urine     Status: Abnormal   Collection Time: 10/10/14  4:09 PM  Result Value Ref Range   Creatinine, Urine 230.57 mg/dL   Total Protein, Urine 433.7 mg/dL   Protein Creatinine Ratio 1.88 (H) 0.00 - 0.15     Assessment/Plan: IUP at 36 1/7 weeks Pre-eclampsia GBS positive  [facility] consult with Dr. Estanislado Pandyivard NST q shift Repeat PIH labs in the am MFM consult  tomorrow for recommendations for timing of delivery. Labetalol IV prn BP parameters Reviewed with patient we would be reviewing her status to determine if induction may be delayed until  37 weeks, or if induction should occur prior to that time. Discussed the care of patient with pre-eclampsia, including use of magnesium sulfate for seizure prophylaxiis.  Giavonni Cizek, VICKICNM, [place] 10/10/2014, 10:45 PM

## 2014-10-11 ENCOUNTER — Ambulatory Visit (HOSPITAL_COMMUNITY)
Admit: 2014-10-11 | Discharge: 2014-10-11 | Disposition: A | Discharge status: Home / Self Care | Payer: Medicaid Other | Attending: Obstetrics and Gynecology | Admitting: Obstetrics and Gynecology

## 2014-10-11 DIAGNOSIS — O1490 Unspecified pre-eclampsia, unspecified trimester: Secondary | ICD-10-CM | POA: Insufficient documentation

## 2014-10-11 DIAGNOSIS — Z3A36 36 weeks gestation of pregnancy: Secondary | ICD-10-CM | POA: Insufficient documentation

## 2014-10-11 LAB — COMPREHENSIVE METABOLIC PANEL
ALBUMIN: 2.2 g/dL — AB (ref 3.5–5.2)
ALT: 6 U/L (ref 0–35)
AST: 11 U/L (ref 0–37)
Alkaline Phosphatase: 168 U/L — ABNORMAL HIGH (ref 39–117)
Anion gap: 11 (ref 5–15)
BUN: 8 mg/dL (ref 6–23)
CALCIUM: 8.7 mg/dL (ref 8.4–10.5)
CO2: 21 mEq/L (ref 19–32)
CREATININE: 0.77 mg/dL (ref 0.50–1.10)
Chloride: 102 mEq/L (ref 96–112)
GFR calc Af Amer: >90 mL/min (ref >90)
GFR calc non Af Amer: >90 mL/min (ref >90)
Glucose, Bld: 68 mg/dL — ABNORMAL LOW (ref 70–99)
Potassium: 4.1 mEq/L (ref 3.7–5.3)
SODIUM: 134 meq/L — AB (ref 137–147)
Total Bilirubin: 0.3 mg/dL (ref 0.3–1.2)
Total Protein: 5.4 g/dL — ABNORMAL LOW (ref 6.0–8.3)

## 2014-10-11 LAB — CBC
HEMATOCRIT: 35.3 % — AB (ref 36.0–46.0)
HEMOGLOBIN: 12.2 g/dL (ref 12.0–15.0)
MCH: 29.9 pg (ref 26.0–34.0)
MCHC: 34.6 g/dL (ref 30.0–36.0)
MCV: 86.5 fL (ref 78.0–100.0)
Platelets: 245 10*3/uL (ref 150–400)
RBC: 4.08 MIL/uL (ref 3.87–5.11)
RDW: 14.9 % (ref 11.5–15.5)
WBC: 9.6 10*3/uL (ref 4.0–10.5)

## 2014-10-11 LAB — URIC ACID: Uric Acid, Serum: 5.3 mg/dL (ref 2.4–7.0)

## 2014-10-11 LAB — TYPE AND SCREEN
ABO/RH(D): O POS
ANTIBODY SCREEN: NEGATIVE

## 2014-10-11 LAB — ABO/RH: ABO/RH(D): O POS

## 2014-10-11 LAB — LACTATE DEHYDROGENASE: LDH: 119 U/L (ref 94–250)

## 2014-10-11 MED ORDER — FAMOTIDINE 20 MG PO TABS: 20.0000 mg | ORAL_TABLET | Freq: Two times a day (BID) | ORAL | Status: AC | PRN

## 2014-10-11 NOTE — Consult Note (Addendum)
MFM consult  23 yr old G2P0010 at 45w2dreferred by VDonnel Saxonfor consult secondary to preeclampsia. Patient was sent to MAU for elevated blood pressures in the office. Patient reports previously had a headache but has since resolved. No abdominal pain or vision changes. Otherwise is without complaints.  Past Ob hx: sab PMH: non contributory  O: BP 147/95 BPs have ranged from 120-156/66-106  Labs are within normal limits  A: 23yr old G2P0010 at 328w2dith preeclampsia without severe features P: 1. I counseled the patient as follows: - patient has met criteria with several mild range blood pressures and positive 24 hour urine - labs done on 10/11/14 were normal - Discussed risks of preeclampsia include: risk of fetal growth restriction, abruption, stillbirth, and risk of eclampsia  - recommend close surveillance for the development of signs/symptoms of preeclampsia with severe features. Instructed patient on symptoms- currently asymptomatic today - may be monitored as inpatient our outpatient based on decision from primary OB; however patient has had some blood pressures close to the severe range so close surveillance is indicated - recommend repeat lab studies" CBC, AST, ALT, uric acid, BUN, creatinine on 12/14 and on day of delivery - recommend antenatal testing - recommend delivery at [redacted] weeks gestation or sooner if there is nonreassuring fetal testing, or if patient has any signs/symptoms of preeclampsia with severe features (if meets that criteria recommend immediate delivery) - recommend close monitoring of blood pressures - would recommend magnesium sulfate for seizure prophylaxis during labor and 24 hours postpartum if patient meets criteria for preeclampsia with severe features. The data is more mixed on the need for magnesium sulfate for seizure prophylaxis for preeclampsia without severe features usually my recommendation is to NOT treat with magnesium sulfate for seizure  prophylaxis for preeclampsia without severe features.  2. Recommend fetal growth if not recently performed- patient reports recently performed- I do not have these results 3. Would not start anti-hypertensive at this point unless has persistent severe range blood pressures then would treat accordingly and proceed with delivery. Treat persistent severe range blood pressures.  All of the patient's questions were answered. I left a message for Dr. RiCletis Mediao discuss the recommendations.  I spent a total of 20 minutes with the patient of which 100% was in face to face consultation.  Please call with questions.  Kr[place]lam CityMD

## 2014-10-11 NOTE — Discharge Summary (Signed)
Physician Discharge Summary  Patient ID: Anita Strickland MRN: [id number] DOB/AGE: 01-19-1991 23 y.o.  Admit date: 10/10/2014 Discharge date: 10/11/2014  Admission Diagnoses: pre eclampsia w/o severe features  Discharge Diagnoses:  pre eclampsia w/o severe features Active Problems:   Pre-eclampsia   Positive GBS test--10/08/14   Smoker   Discharged Condition: stable  Hospital Course: antepartum testing, MFM consult  PreNatal Labs ABO, Rh: O/Positive/-- (10/25 0000) Antibody:  Neg Rubella:  Immune RPR:   Neg HBsAg:   Neg HIV:   NR GBS:  Positive 10/08/14 Sickle cell/Hgb electrophoresis: AA Pap: 04/11/14 WNL GC: Negative 03/01/14, 10/08/14 Chlamydia: Negative 03/01/14, 10/08/14 Genetic screenings: Declined Glucola: WNL Other: Hgb 12.9 at NOB, 12.4 at 34 weeks  Consults: MFM  Significant Diagnostic Studies: pre eclampsia  Treatments: IOL scheduled  Discharge Exam: Blood pressure 147/95, pulse 74, temperature 97.2 F (36.2 C), temperature source Axillary, resp. rate 18, height 5' (1.524 m), weight 182 lb (82.555 kg), last menstrual period 12/02/2013. General appearance: alert and cooperative Neurologic: Alert and oriented X 3, normal strength and tone. Normal symmetric reflexes. Normal coordination and gait, denies HA vision changes, or URQ pain  Disposition: 01-Home or Self Care with instruction to monitor for s/s of severe features.   Discharge Instructions    Discharge activity:    Complete by:  As directed   Modified bedrest: out of bed 2 hours in the morning, afternoon and evening.     Discharge diet:  No restrictions    Complete by:  As directed      Discharge instructions    Complete by:  As directed   Per [facility] handout     Do not have sex or do anything that might make you have an orgasm    Complete by:  As directed      Fetal Kick Count:  Lie on our left side for one hour after a meal, and count the number of times your baby kicks.  If it is less than 5  times, get up, move around and drink some juice.  Repeat the test 30 minutes later.  If it is still less than 5 kicks in an hour, notify your doctor.    Complete by:  As directed      LABOR:  When conractions begin, you should start to time them from the beginning of one contraction to the beginning  of the next.  When contractions are 5 - 10 minutes apart or less and have been regular for at least an hour, you should call your health care provider.    Complete by:  As directed      Notify physician for bleeding from the vagina    Complete by:  As directed      Notify physician for blurring of vision or spots before the eyes    Complete by:  As directed      Notify physician for chills or fever    Complete by:  As directed      Notify physician for fainting spells, "black outs" or loss of consciousness    Complete by:  As directed      Notify physician for increase in vaginal discharge    Complete by:  As directed      Notify physician for leaking of fluid    Complete by:  As directed      Notify physician for pain or burning when urinating    Complete by:  As directed      Notify physician  for pelvic pressure (sudden increase)    Complete by:  As directed      Notify physician for severe or continued nausea or vomiting    Complete by:  As directed      Notify physician for sudden gushing of fluid from the vagina (with or without continued leaking)    Complete by:  As directed      Notify physician for sudden, constant, or occasional abdominal pain    Complete by:  As directed      Notify physician if baby moving less than usual    Complete by:  As directed          Pt [facility], US for growth, NST & BPP Pt to keep IOL appointment at 37.0 weeks on Wednesday 10/16/14 at 7:30pm.      Medication List    STOP taking these medications        acetaminophen 500 MG tablet  Commonly known as:  TYLENOL      TAKE these medications         omeprazole 20 MG tablet  Commonly known as:  PRILOSEC OTC  Take 20 mg by mouth daily as needed (for heartburn).     prenatal multivitamin Tabs tablet  Take 1 tablet by mouth daily.      ASK your doctor about these medications        fluconazole 150 MG tablet  Commonly known as:  DIFLUCAN  Take 1 tablet (150 mg total) by mouth once.           Follow-up Information    [facility]   Why:  PIH labs, US for growth, BPP and NST    Contact information:   [address] [place] [phone]      [facility] with [facility].   Why:  Keep appointment for induction at 7:30pm   Contact information:   [address] [place] [place] [phone]      Signed: Adelina MingsVenus Master Touchet, CNM, MSN 10/11/2014, 3:53 PM

## 2014-10-11 NOTE — Progress Notes (Addendum)
Hospital day # 1 pregnancy at 868w2d-- pre eclamptic hypertension, w/o sever features  S:  Doing well--      Denies HA, visual changes, epigastric pain, SOB, or chest pain.        Perception of contractions: none      Vaginal bleeding: none now       Vaginal discharge:  no significant change   O: BP 154/97 mmHg  Pulse 79  Temp(Src) 98.4 F (36.9 C) (Oral)  Resp 16  Ht 5' (1.524 m)  Wt 182 lb (82.555 kg)  BMI 35.54 kg/m2  LMP 12/02/2013             FHR: Catagory  1      CTXs: none      Uterus gravid and non-tender       Extremities: extremities normal, atraumatic, no cyanosis or edema and no significant edema and no signs of DVT      Abd: soft, non-tender, +BS, no rebound, no guarding       Respiratory: CTAB. Effort normal.              @LABS @       Results for orders placed or performed during the hospital encounter of 10/10/14 (from the past 24 hour(s))  CBC     Status: None   Collection Time: 10/10/14  4:07 PM  Result Value Ref Range   WBC 8.9 4.0 - 10.5 K/uL   RBC 4.40 3.87 - 5.11 MIL/uL   Hemoglobin 13.1 12.0 - 15.0 g/dL   HCT 16.137.8 09.636.0 - 04.546.0 %   MCV 85.9 78.0 - 100.0 fL   MCH 29.8 26.0 - 34.0 pg   MCHC 34.7 30.0 - 36.0 g/dL   RDW 40.914.8 81.111.5 - 91.415.5 %   Platelets 259 150 - 400 K/uL  Comprehensive metabolic panel     Status: Abnormal   Collection Time: 10/10/14  4:07 PM  Result Value Ref Range   Sodium 135 (L) 137 - 147 mEq/L   Potassium 3.6 (L) 3.7 - 5.3 mEq/L   Chloride 101 96 - 112 mEq/L   CO2 21 19 - 32 mEq/L   Glucose, Bld 120 (H) 70 - 99 mg/dL   BUN 5 (L) 6 - 23 mg/dL   Creatinine, Ser 7.820.71 0.50 - 1.10 mg/dL   Calcium 9.2 8.4 - 95.610.5 mg/dL   Total Protein 6.3 6.0 - 8.3 g/dL   Albumin 2.5 (L) 3.5 - 5.2 g/dL   AST 12 0 - 37 U/L   ALT 8 0 - 35 U/L   Alkaline Phosphatase 181 (H) 39 - 117 U/L   Total Bilirubin 0.4 0.3 - 1.2 mg/dL   GFR calc non Af Amer >90 >90 mL/min   GFR calc Af Amer >90 >90 mL/min   Anion gap 13 5 - 15  Uric acid     Status: None   Collection Time: 10/10/14  4:07 PM  Result Value Ref Range   Uric Acid, Serum 5.4 2.4 - 7.0 mg/dL  Lactate dehydrogenase     Status: None   Collection Time: 10/10/14  4:07 PM  Result Value Ref Range   LDH 163 94 - 250 U/L  Protein / creatinine ratio, urine     Status: Abnormal   Collection Time: 10/10/14  4:09 PM  Result Value Ref Range   Creatinine, Urine 230.57 mg/dL   Total Protein, Urine 433.7 mg/dL   Protein Creatinine Ratio 1.88 (H) 0.00 - 0.15  CBC     Status:  Abnormal   Collection Time: 10/11/14  4:45 AM  Result Value Ref Range   WBC 9.6 4.0 - 10.5 K/uL   RBC 4.08 3.87 - 5.11 MIL/uL   Hemoglobin 12.2 12.0 - 15.0 g/dL   HCT 16.135.3 (L) 09.636.0 - 04.546.0 %   MCV 86.5 78.0 - 100.0 fL   MCH 29.9 26.0 - 34.0 pg   MCHC 34.6 30.0 - 36.0 g/dL   RDW 40.914.9 81.111.5 - 91.415.5 %   Platelets 245 150 - 400 K/uL  Uric acid     Status: None   Collection Time: 10/11/14  4:45 AM  Result Value Ref Range   Uric Acid, Serum 5.3 2.4 - 7.0 mg/dL  Lactate dehydrogenase     Status: None   Collection Time: 10/11/14  4:45 AM  Result Value Ref Range   LDH 119 94 - 250 U/L  Comprehensive metabolic panel     Status: Abnormal   Collection Time: 10/11/14  4:45 AM  Result Value Ref Range   Sodium 134 (L) 137 - 147 mEq/L   Potassium 4.1 3.7 - 5.3 mEq/L   Chloride 102 96 - 112 mEq/L   CO2 21 19 - 32 mEq/L   Glucose, Bld 68 (L) 70 - 99 mg/dL   BUN 8 6 - 23 mg/dL   Creatinine, Ser 7.820.77 0.50 - 1.10 mg/dL   Calcium 8.7 8.4 - 95.610.5 mg/dL   Total Protein 5.4 (L) 6.0 - 8.3 g/dL   Albumin 2.2 (L) 3.5 - 5.2 g/dL   AST 11 0 - 37 U/L   ALT 6 0 - 35 U/L   Alkaline Phosphatase 168 (H) 39 - 117 U/L   Total Bilirubin 0.3 0.3 - 1.2 mg/dL   GFR calc non Af Amer >90 >90 mL/min   GFR calc Af Amer >90 >90 mL/min   Anion gap 11 5 - 15  Type and screen     Status: None   Collection Time: 10/11/14  4:45 AM  Result Value Ref Range   ABO/RH(D) O POS    Antibody Screen NEG    Sample Expiration 10/14/2014   ABO/Rh     Status:  None   Collection Time: 10/11/14  4:45 AM  Result Value Ref Range   ABO/RH(D) O POS         Meds: Current facility-administered medications: 0.9 %  sodium chloride infusion, 250 mL, Intravenous, PRN, Nigel BridgemanVicki Latham, CNM;  acetaminophen (TYLENOL) tablet 650 mg, 650 mg, Oral, Q4H PRN, Nigel BridgemanVicki Latham, CNM;  calcium carbonate (TUMS - dosed in mg elemental calcium) chewable tablet 400 mg of elemental calcium, 2 tablet, Oral, Q4H PRN, Nigel BridgemanVicki Latham, CNM, 400 mg of elemental calcium at 10/11/14 [id number] docusate sodium (COLACE) capsule 100 mg, 100 mg, Oral, Daily, Nigel BridgemanVicki Latham, CNM;  famotidine (PEPCID) tablet 20 mg, 20 mg, Oral, BID PRN, Silverio LaySandra Kanitra Purifoy, MD;  labetalol (NORMODYNE,TRANDATE) injection 10-40 mg, 10-40 mg, Intravenous, Q15 min PRN, Nigel BridgemanVicki Latham, CNM;  prenatal multivitamin tablet 1 tablet, 1 tablet, Oral, Q1200, Nigel BridgemanVicki Latham, CNM;  sodium chloride 0.9 % injection 3 mL, 3 mL, Intravenous, Q12H, Nigel BridgemanVicki Latham, CNM, 3 mL at 10/10/14 2200 sodium chloride 0.9 % injection 3 mL, 3 mL, Intravenous, PRN, Nigel BridgemanVicki Latham, CNM;  zolpidem (AMBIEN) tablet 5 mg, 5 mg, Oral, QHS PRN, Nigel BridgemanVicki Latham, CNM, 5 mg at 10/10/14 2327  Filed Vitals:   10/10/14 2250 10/11/14 0003 10/11/14 0631 10/11/14 0736  BP: 149/95 139/91 120/66 154/97  Pulse: 79 80 75 79  Temp:  98.8 F (37.1  C) 98.3 F (36.8 C) 98.4 F (36.9 C)  TempSrc:  Oral Oral Oral  Resp: 18 18 16    Height:      Weight:         A: [redacted]w[redacted]d with pre eclampsia w/o severe features     stable     Meds: Magnesium sulfate, Labetalol, and Nifedipine     GBS: positive     BP wnl     FHT 135, + accel, no decel, moderate varibility no ctx   P: Continue current plan of care       Upcoming tests/treatments:  Pending MFM consult      Plan reviewed with patient       HTN parameters       Consulted with Dr. Estanislado Pandy      MDs will follow    Venus Standard, CNM, MSN 10/11/2014. 11:21 AM  Discussed with Dr Vincenza Hews OK to discharge home with modified bedrest Patient to  call if any severe symptoms Return here on Monday for BP check, BPP and repeat labs Will schedule for 2 stage induction at 37 weeks 10/16/14 If develops severe features will then proceed with immediate induction / delivery

## 2014-10-11 NOTE — Discharge Instructions (Signed)
Hypertension During Pregnancy Hypertension, or high blood pressure, is when there is extra pressure inside your blood vessels that carry blood from the heart to the rest of your body (arteries). It can happen at any time in life, including pregnancy. Hypertension during pregnancy can cause problems for you and your baby. Your baby might not weigh as much as he or she should at birth or might be born early (premature). Very bad cases of hypertension during pregnancy can be life-threatening.  Different types of hypertension can occur during pregnancy. These include:  Chronic hypertension. This happens when a woman has hypertension before pregnancy and it continues during pregnancy.  Gestational hypertension. This is when hypertension develops during pregnancy.  Preeclampsia or toxemia of pregnancy. This is a very serious type of hypertension that develops only during pregnancy. It affects the whole body and can be very dangerous for both mother and baby.  Gestational hypertension and preeclampsia usually go away after your baby is born. Your blood pressure will likely stabilize within 6 weeks. Women who have hypertension during pregnancy have a greater chance of developing hypertension later in life or with future pregnancies. RISK FACTORS There are certain factors that make it more likely for you to develop hypertension during pregnancy. These include:  Having hypertension before pregnancy.  Having hypertension during a previous pregnancy.  Being overweight.  Being older than 40 years.  Being pregnant with more than one baby.  Having diabetes or kidney problems. SIGNS AND SYMPTOMS Chronic and gestational hypertension rarely cause symptoms. Preeclampsia has symptoms, which may include:  Increased protein in your urine. Your health care provider will check for this at every prenatal visit.  Swelling of your hands and face.  Rapid weight gain.  Headaches.  Visual changes.  Being  bothered by light.  Abdominal pain, especially in the upper right area.  Chest pain.  Shortness of breath.  Increased reflexes.  Seizures. These occur with a more severe form of preeclampsia, called eclampsia. DIAGNOSIS  You may be diagnosed with hypertension during a regular prenatal exam. At each prenatal visit, you may have:  Your blood pressure checked.  A urine test to check for protein in your urine. The type of hypertension you are diagnosed with depends on when you developed it. It also depends on your specific blood pressure reading.  Developing hypertension before 20 weeks of pregnancy is consistent with chronic hypertension.  Developing hypertension after 20 weeks of pregnancy is consistent with gestational hypertension.  Hypertension with increased urinary protein is diagnosed as preeclampsia.  Blood pressure measurements that stay above 160 systolic or 110 diastolic are a sign of severe preeclampsia. TREATMENT Treatment for hypertension during pregnancy varies. Treatment depends on the type of hypertension and how serious it is.  If you take medicine for chronic hypertension, you may need to switch medicines.  Medicines called ACE inhibitors should not be taken during pregnancy.  Low-dose aspirin may be suggested for women who have risk factors for preeclampsia.  If you have gestational hypertension, you may need to take a blood pressure medicine that is safe during pregnancy. Your health care provider will recommend the correct medicine.  If you have severe preeclampsia, you may need to be in the hospital. Health care providers will watch you and your baby very closely. You also may need to take medicine called magnesium sulfate to prevent seizures and lower blood pressure.  Sometimes, an early delivery is needed. This may be the case if the condition worsens. It would be   done to protect you and your baby. The only cure for preeclampsia is delivery.  Your health  care provider may recommend that you take one low-dose aspirin (81 mg) each day to help prevent high blood pressure during your pregnancy if you are at risk for preeclampsia. You may be at risk for preeclampsia if:  You had preeclampsia or eclampsia during a previous pregnancy.  Your baby did not grow as expected during a previous pregnancy.  You experienced preterm birth with a previous pregnancy.  You experienced a separation of the placenta from the uterus (placental abruption) during a previous pregnancy.  You experienced the loss of your baby during a previous pregnancy.  You are pregnant with more than one baby.  You have other medical conditions, such as diabetes or an autoimmune disease. HOME CARE INSTRUCTIONS  Schedule and keep all of your regular prenatal care appointments. This is important.  Take medicines only as directed by your health care provider. Tell your health care provider about all medicines you take.  Eat as little salt as possible.  Get regular exercise.  Do not drink alcohol.  Do not use tobacco products.  Do not drink products with caffeine.  Lie on your left side when resting. SEEK IMMEDIATE MEDICAL CARE IF:  You have severe abdominal pain.  You have sudden swelling in your hands, ankles, or face.  You gain 4 pounds (1.8 kg) or more in 1 week.  You vomit repeatedly.  You have vaginal bleeding.  You do not feel your baby moving as much.  You have a headache.  You have blurred or double vision.  You have muscle twitching or spasms.  You have shortness of breath.  You have blue fingernails or lips.  You have blood in your urine. MAKE SURE YOU:  Understand these instructions.  Will watch your condition.  Will get help right away if you are not doing well or get worse. Document Released: 07/06/2011 Document Revised: 03/04/2014 Document Reviewed: 05/17/2013 [employer] Patient Information 2015 [employer]. This information is not  intended to replace advice given to you by your health care provider. Make sure you discuss any questions you have with your health care provider.   Fetal Movement Counts Patient Name: __________________________________________________ Patient Due Date: ____________________ Performing a fetal movement count is highly recommended in high-risk pregnancies, but it is good for every pregnant woman to do. Your health care provider may ask you to start counting fetal movements at 28 weeks of the pregnancy. Fetal movements often increase:  After eating a full meal.  After physical activity.  After eating or drinking something sweet or cold.  At rest. Pay attention to when you feel the baby is most active. This will help you notice a pattern of your baby's sleep and wake cycles and what factors contribute to an increase in fetal movement. It is important to perform a fetal movement count at the same time each day when your baby is normally most active.  HOW TO COUNT FETAL MOVEMENTS  Find a quiet and comfortable area to sit or lie down on your left side. Lying on your left side provides the best blood and oxygen circulation to your baby.  Write down the day and time on a sheet of paper or in a journal.  Start counting kicks, flutters, swishes, rolls, or jabs in a 2-hour period. You should feel at least 10 movements within 2 hours.  If you do not feel 10 movements in 2 hours, wait 2-3   hours and count again. Look for a change in the pattern or not enough counts in 2 hours. SEEK MEDICAL CARE IF:  You feel less than 10 counts in 2 hours, tried twice.  There is no movement in over an hour.  The pattern is changing or taking longer each day to reach 10 counts in 2 hours.  You feel the baby is not moving as he or she usually does. Date: ____________ Movements: ____________ Start time: ____________ Finish time: ____________  Date: ____________ Movements: ____________ Start time: ____________ Finish  time: ____________ Date: ____________ Movements: ____________ Start time: ____________ Finish time: ____________ Date: ____________ Movements: ____________ Start time: ____________ Finish time: ____________ Date: ____________ Movements: ____________ Start time: ____________ Finish time: ____________ Date: ____________ Movements: ____________ Start time: ____________ Finish time: ____________ Date: ____________ Movements: ____________ Start time: ____________ Finish time: ____________ Date: ____________ Movements: ____________ Start time: ____________ Finish time: ____________  Date: ____________ Movements: ____________ Start time: ____________ Finish time: ____________ Date: ____________ Movements: ____________ Start time: ____________ Finish time: ____________ Date: ____________ Movements: ____________ Start time: ____________ Finish time: ____________ Date: ____________ Movements: ____________ Start time: ____________ Finish time: ____________ Date: ____________ Movements: ____________ Start time: ____________ Finish time: ____________ Date: ____________ Movements: ____________ Start time: ____________ Finish time: ____________ Date: ____________ Movements: ____________ Start time: ____________ Finish time: ____________  Date: ____________ Movements: ____________ Start time: ____________ Finish time: ____________ Date: ____________ Movements: ____________ Start time: ____________ Finish time: ____________ Date: ____________ Movements: ____________ Start time: ____________ Finish time: ____________ Date: ____________ Movements: ____________ Start time: ____________ Finish time: ____________ Date: ____________ Movements: ____________ Start time: ____________ Finish time: ____________ Date: ____________ Movements: ____________ Start time: ____________ Finish time: ____________ Date: ____________ Movements: ____________ Start time: ____________ Finish time: ____________  Date: ____________  Movements: ____________ Start time: ____________ Finish time: ____________ Date: ____________ Movements: ____________ Start time: ____________ Finish time: ____________ Date: ____________ Movements: ____________ Start time: ____________ Finish time: ____________ Date: ____________ Movements: ____________ Start time: ____________ Finish time: ____________ Date: ____________ Movements: ____________ Start time: ____________ Finish time: ____________ Date: ____________ Movements: ____________ Start time: ____________ Finish time: ____________ Date: ____________ Movements: ____________ Start time: ____________ Finish time: ____________  Date: ____________ Movements: ____________ Start time: ____________ Finish time: ____________ Date: ____________ Movements: ____________ Start time: ____________ Finish time: ____________ Date: ____________ Movements: ____________ Start time: ____________ Finish time: ____________ Date: ____________ Movements: ____________ Start time: ____________ Finish time: ____________ Date: ____________ Movements: ____________ Start time: ____________ Finish time: ____________ Date: ____________ Movements: ____________ Start time: ____________ Finish time: ____________ Date: ____________ Movements: ____________ Start time: ____________ Finish time: ____________  Date: ____________ Movements: ____________ Start time: ____________ Finish time: ____________ Date: ____________ Movements: ____________ Start time: ____________ Finish time: ____________ Date: ____________ Movements: ____________ Start time: ____________ Finish time: ____________ Date: ____________ Movements: ____________ Start time: ____________ Finish time: ____________ Date: ____________ Movements: ____________ Start time: ____________ Finish time: ____________ Date: ____________ Movements: ____________ Start time: ____________ Finish time: ____________ Date: ____________ Movements: ____________ Start time:  ____________ Finish time: ____________  Date: ____________ Movements: ____________ Start time: ____________ Finish time: ____________ Date: ____________ Movements: ____________ Start time: ____________ Finish time: ____________ Date: ____________ Movements: ____________ Start time: ____________ Finish time: ____________ Date: ____________ Movements: ____________ Start time: ____________ Finish time: ____________ Date: ____________ Movements: ____________ Start time: ____________ Finish time: ____________ Date: ____________ Movements: ____________ Start time: ____________ Finish time: ____________ Date: ____________ Movements: ____________ Start time: ____________ Finish time: ____________  Date: ____________ Movements: ____________ Start time: ____________ Finish time: ____________ Date: ____________ Movements: ____________ Start   time: ____________ Finish time: ____________ Date: ____________ Movements: ____________ Start time: ____________ Finish time: ____________ Date: ____________ Movements: ____________ Start time: ____________ Finish time: ____________ Date: ____________ Movements: ____________ Start time: ____________ Finish time: ____________ Date: ____________ Movements: ____________ Start time: ____________ Finish time: ____________ Document Released: 11/17/2006 Document Revised: 03/04/2014 Document Reviewed: 08/14/2012 [employer] Patient Information 2015 [employer]. This information is not intended to replace advice given to you by your health care provider. Make sure you discuss any questions you have with your health care provider.  

## 2014-10-11 NOTE — Progress Notes (Addendum)
S: In to check on pt. Sleeping. O: BPs 139-155/91-102. No Labetalol required during the night. Results for orders placed or performed during the hospital encounter of 10/10/14 (from the past 24 hour(s))  CBC     Status: None   Collection Time: 10/10/14  4:07 PM  Result Value Ref Range   WBC 8.9 4.0 - 10.5 K/uL   RBC 4.40 3.87 - 5.11 MIL/uL   Hemoglobin 13.1 12.0 - 15.0 g/dL   HCT 04.537.8 40.936.0 - 81.146.0 %   MCV 85.9 78.0 - 100.0 fL   MCH 29.8 26.0 - 34.0 pg   MCHC 34.7 30.0 - 36.0 g/dL   RDW 91.414.8 78.211.5 - 95.615.5 %   Platelets 259 150 - 400 K/uL  Comprehensive metabolic panel     Status: Abnormal   Collection Time: 10/10/14  4:07 PM  Result Value Ref Range   Sodium 135 (L) 137 - 147 mEq/L   Potassium 3.6 (L) 3.7 - 5.3 mEq/L   Chloride 101 96 - 112 mEq/L   CO2 21 19 - 32 mEq/L   Glucose, Bld 120 (H) 70 - 99 mg/dL   BUN 5 (L) 6 - 23 mg/dL   Creatinine, Ser 2.130.71 0.50 - 1.10 mg/dL   Calcium 9.2 8.4 - 08.610.5 mg/dL   Total Protein 6.3 6.0 - 8.3 g/dL   Albumin 2.5 (L) 3.5 - 5.2 g/dL   AST 12 0 - 37 U/L   ALT 8 0 - 35 U/L   Alkaline Phosphatase 181 (H) 39 - 117 U/L   Total Bilirubin 0.4 0.3 - 1.2 mg/dL   GFR calc non Af Amer >90 >90 mL/min   GFR calc Af Amer >90 >90 mL/min   Anion gap 13 5 - 15  Uric acid     Status: None   Collection Time: 10/10/14  4:07 PM  Result Value Ref Range   Uric Acid, Serum 5.4 2.4 - 7.0 mg/dL  Lactate dehydrogenase     Status: None   Collection Time: 10/10/14  4:07 PM  Result Value Ref Range   LDH 163 94 - 250 U/L  Protein / creatinine ratio, urine     Status: Abnormal   Collection Time: 10/10/14  4:09 PM  Result Value Ref Range   Creatinine, Urine 230.57 mg/dL   Total Protein, Urine 433.7 mg/dL   Protein Creatinine Ratio 1.88 (H) 0.00 - 0.15  CBC     Status: Abnormal   Collection Time: 10/11/14  4:45 AM  Result Value Ref Range   WBC 9.6 4.0 - 10.5 K/uL   RBC 4.08 3.87 - 5.11 MIL/uL   Hemoglobin 12.2 12.0 - 15.0 g/dL   HCT 57.835.3 (L) 46.936.0 - 62.946.0 %   MCV  86.5 78.0 - 100.0 fL   MCH 29.9 26.0 - 34.0 pg   MCHC 34.6 30.0 - 36.0 g/dL   RDW 52.814.9 41.311.5 - 24.415.5 %   Platelets 245 150 - 400 K/uL  Uric acid     Status: None   Collection Time: 10/11/14  4:45 AM  Result Value Ref Range   Uric Acid, Serum 5.3 2.4 - 7.0 mg/dL  Lactate dehydrogenase     Status: None   Collection Time: 10/11/14  4:45 AM  Result Value Ref Range   LDH 119 94 - 250 U/L  Comprehensive metabolic panel     Status: Abnormal   Collection Time: 10/11/14  4:45 AM  Result Value Ref Range   Sodium 134 (L) 137 - 147 mEq/L  Potassium 4.1 3.7 - 5.3 mEq/L   Chloride 102 96 - 112 mEq/L   CO2 21 19 - 32 mEq/L   Glucose, Bld 68 (L) 70 - 99 mg/dL   BUN 8 6 - 23 mg/dL   Creatinine, Ser 9.620.77 0.50 - 1.10 mg/dL   Calcium 8.7 8.4 - 95.210.5 mg/dL   Total Protein 5.4 (L) 6.0 - 8.3 g/dL   Albumin 2.2 (L) 3.5 - 5.2 g/dL   AST 11 0 - 37 U/L   ALT 6 0 - 35 U/L   Alkaline Phosphatase 168 (H) 39 - 117 U/L   Total Bilirubin 0.3 0.3 - 1.2 mg/dL   GFR calc non Af Amer >90 >90 mL/min   GFR calc Af Amer >90 >90 mL/min   Anion gap 11 5 - 15  Type and screen     Status: None (Preliminary result)   Collection Time: 10/11/14  4:45 AM  Result Value Ref Range   ABO/RH(D) O POS    Antibody Screen PENDING    Sample Expiration 10/14/2014    FHR: BL 130 w/ moderate variability, +accels, no decels Ctxs: Uterine irritability only  A: 23 yo G2P0010 @ 36.2 wks. Preeclampsia. Normal labs this am. Cat 1 FHRT.  P: Continue w/ current plan. Will update Dr. Sallye OberKulwa on BPs and labs.   Sherre ScarletKimberly Laconda Basich, CNM 10/11/2014, 6:03 AM

## 2014-10-14 ENCOUNTER — Encounter (HOSPITAL_COMMUNITY): Payer: Self-pay | Admitting: *Deleted

## 2014-10-14 ENCOUNTER — Inpatient Hospital Stay (HOSPITAL_COMMUNITY)
Admission: AD | Admit: 2014-10-14 | Discharge: 2014-10-14 | Disposition: A | Discharge status: Home / Self Care | Payer: Medicaid Other | Source: Ambulatory Visit | Attending: Obstetrics and Gynecology | Admitting: Obstetrics and Gynecology

## 2014-10-14 ENCOUNTER — Inpatient Hospital Stay (HOSPITAL_COMMUNITY): Payer: Medicaid Other

## 2014-10-14 ENCOUNTER — Other Ambulatory Visit: Payer: Self-pay | Admitting: Obstetrics and Gynecology

## 2014-10-14 DIAGNOSIS — O99333 Smoking (tobacco) complicating pregnancy, third trimester: Secondary | ICD-10-CM | POA: Diagnosis not present

## 2014-10-14 DIAGNOSIS — O1403 Mild to moderate pre-eclampsia, third trimester: Secondary | ICD-10-CM | POA: Insufficient documentation

## 2014-10-14 DIAGNOSIS — Z3A36 36 weeks gestation of pregnancy: Secondary | ICD-10-CM | POA: Diagnosis not present

## 2014-10-14 DIAGNOSIS — O365930 Maternal care for other known or suspected poor fetal growth, third trimester, not applicable or unspecified: Secondary | ICD-10-CM | POA: Insufficient documentation

## 2014-10-14 DIAGNOSIS — Z9884 Bariatric surgery status: Secondary | ICD-10-CM | POA: Diagnosis not present

## 2014-10-14 DIAGNOSIS — O1490 Unspecified pre-eclampsia, unspecified trimester: Secondary | ICD-10-CM

## 2014-10-14 DIAGNOSIS — O99820 Streptococcus B carrier state complicating pregnancy: Secondary | ICD-10-CM | POA: Insufficient documentation

## 2014-10-14 DIAGNOSIS — O1493 Unspecified pre-eclampsia, third trimester: Secondary | ICD-10-CM

## 2014-10-14 DIAGNOSIS — Z8719 Personal history of other diseases of the digestive system: Secondary | ICD-10-CM | POA: Insufficient documentation

## 2014-10-14 DIAGNOSIS — Z8711 Personal history of peptic ulcer disease: Secondary | ICD-10-CM | POA: Insufficient documentation

## 2014-10-14 LAB — COMPREHENSIVE METABOLIC PANEL
ALBUMIN: 2.3 g/dL — AB (ref 3.5–5.2)
ALK PHOS: 183 U/L — AB (ref 39–117)
ALT: 8 U/L (ref 0–35)
ANION GAP: 13 (ref 5–15)
AST: 14 U/L (ref 0–37)
BUN: 7 mg/dL (ref 6–23)
CO2: 20 mEq/L (ref 19–32)
Calcium: 8.8 mg/dL (ref 8.4–10.5)
Chloride: 104 mEq/L (ref 96–112)
Creatinine, Ser: 0.64 mg/dL (ref 0.50–1.10)
GFR calc Af Amer: >90 mL/min (ref >90)
GFR calc non Af Amer: >90 mL/min (ref >90)
Glucose, Bld: 72 mg/dL (ref 70–99)
Potassium: 3.9 mEq/L (ref 3.7–5.3)
Sodium: 137 mEq/L (ref 137–147)
TOTAL PROTEIN: 6.3 g/dL (ref 6.0–8.3)
Total Bilirubin: 0.3 mg/dL (ref 0.3–1.2)

## 2014-10-14 LAB — CBC
HEMATOCRIT: 36.6 % (ref 36.0–46.0)
Hemoglobin: 12.9 g/dL (ref 12.0–15.0)
MCH: 30.4 pg (ref 26.0–34.0)
MCHC: 35.2 g/dL (ref 30.0–36.0)
MCV: 86.3 fL (ref 78.0–100.0)
Platelets: 230 10*3/uL (ref 150–400)
RBC: 4.24 MIL/uL (ref 3.87–5.11)
RDW: 14.8 % (ref 11.5–15.5)
WBC: 8.9 10*3/uL (ref 4.0–10.5)

## 2014-10-14 LAB — PROTEIN / CREATININE RATIO, URINE
CREATININE, URINE: 95.5 mg/dL
Protein Creatinine Ratio: 1.89 — ABNORMAL HIGH (ref 0.00–0.15)
TOTAL PROTEIN, URINE: 180.2 mg/dL

## 2014-10-14 LAB — URINALYSIS, ROUTINE W REFLEX MICROSCOPIC
Bilirubin Urine: NEGATIVE
Glucose, UA: NEGATIVE mg/dL
Hgb urine dipstick: NEGATIVE
KETONES UR: NEGATIVE mg/dL
Leukocytes, UA: NEGATIVE
NITRITE: NEGATIVE
PROTEIN: 100 mg/dL — AB
Specific Gravity, Urine: 1.025 (ref 1.005–1.030)
Urobilinogen, UA: 1 mg/dL (ref 0.0–1.0)
pH: 6.5 (ref 5.0–8.0)

## 2014-10-14 LAB — URINE MICROSCOPIC-ADD ON

## 2014-10-14 LAB — LACTATE DEHYDROGENASE: LDH: 134 U/L (ref 94–250)

## 2014-10-14 LAB — URIC ACID: Uric Acid, Serum: 5.2 mg/dL (ref 2.4–7.0)

## 2014-10-14 NOTE — Discharge Instructions (Signed)

## 2014-10-14 NOTE — MAU Note (Signed)
Patient presents stating that she was sent to [facility] Patient denies bleeding or LOF and states fetus is active.

## 2014-10-14 NOTE — MAU Note (Addendum)
Pt was on 24 hour obs last Thurs and discharged on Friday for elevated BP during this pregnancy.  Pt was not seen in the office today but was instructed to come to MAU for further evaluation.  Today pt denies headache or vision changes.  Denies any RUQ abd pain.  States her edema in lower extremities is actually better today than usual.  Pt states baby is very active.

## 2014-10-14 NOTE — MAU Provider Note (Signed)
History   23 yo G2P0010 at 336 5/7 weeks presented for repeat PIH labs, PCR, NST, serial BPs and US for growth/fluid/BPP.  Patient being followed for pre-eclampsia, with recent admission 12/10-12/11 for monitoring.  MFM consult was obtained, and recommendations made for induction at 37 weeks if no severe features occur.   She was directed to report to MAU today for f/u labs and US per MFM recommendation.   Patient denies HA, visual sx, or epigastric pain.  Reports swelling is better, and notes good FM.  She is scheduled for induction on 10/16/14 at 7:30p.  Patient Active Problem List   Diagnosis Date Noted  . H/O gastric ulcer 10/14/2014  . Preeclampsia   . [redacted] weeks gestation of pregnancy   . Positive GBS test--10/08/14 10/10/2014  . Smoker 10/10/2014    Chief Complaint  Patient presents with  . Pre-Eclampsia   HPI:  See above  OB History    Gravida Para Term Preterm AB TAB SAB Ectopic Multiple Living   2 0 0 0 1 0 1 0 0 0       Past Medical History  Diagnosis Date  . Medical history non-contributory     Past Surgical History  Procedure Laterality Date  . Abcess drainage      leg  . Esophagogastroduodenoscopy endoscopy      History reviewed. No pertinent family history.  History  Substance Use Topics  . Smoking status: Current Every Day Smoker -- 0.10 packs/day    Types: Cigarettes  . Smokeless tobacco: Never Used  . Alcohol Use: Yes    Allergies: No Known Allergies  Prescriptions prior to admission  Medication Sig Dispense Refill Last Dose  . omeprazole (PRILOSEC OTC) 20 MG tablet Take 20 mg by mouth daily as needed (for heartburn).    Past Month at Unknown time  . Prenatal Vit-Fe Fumarate-FA (PRENATAL MULTIVITAMIN) TABS tablet Take 1 tablet by mouth daily.   10/03/2014 at Unknown time  . fluconazole (DIFLUCAN) 150 MG tablet Take 1 tablet (150 mg total) by mouth once. (Patient not taking: Reported on 10/09/2014) 3 tablet 0 Completed Course at Unknown time     ROS:  +FM Physical Exam   Blood pressure 155/94, pulse 79, temperature 98.7 F (37.1 C), temperature source Oral, resp. rate 18, height 5' 2.5" (1.588 m), weight 181 lb 2 oz (82.158 kg), last menstrual period 12/02/2013.   Filed Vitals:   10/14/14 1130 10/14/14 1146 10/14/14 1201 10/14/14 1216  BP: 144/103 144/109 145/95 155/94  Pulse: 84 77 72 79  Temp:      TempSrc:      Resp:      Height:      Weight:       BP range on recent admission  Physical Exam  In NAD Chest clear Heart RRR without mumur Abd gravid, NT Pelvic--deferred Ext DTR 1+, no clonus, 1+ edema  FHR Category 1 UCs irregular, mild  ED Course  Assessment: IUP at 36 5/7 weeks Pre-eclampsia without severe features GBS positive   Plan: PIH labs and PCR US for growth/fluid/BPP Reviewed process of induction, including likelihood of need for cervical ripening and serial induction. Patient plans epidural.   Ray ChurchLATHAM, Jannelle Notaro CNM, MSN 10/14/2014 12:41 PM   Addendum: Returned from US, awaiting report.  BP since returning from US: 146/95 145/99 139/93 148/99  Results for orders placed or performed during the hospital encounter of 10/14/14 (from the past 24 hour(s))  Urinalysis, Routine w reflex microscopic     Status:  Abnormal   Collection Time: 10/14/14 10:53 AM  Result Value Ref Range   Color, Urine YELLOW YELLOW   APPearance CLEAR CLEAR   Specific Gravity, Urine 1.025 1.005 - 1.030   pH 6.5 5.0 - 8.0   Glucose, UA NEGATIVE NEGATIVE mg/dL   Hgb urine dipstick NEGATIVE NEGATIVE   Bilirubin Urine NEGATIVE NEGATIVE   Ketones, ur NEGATIVE NEGATIVE mg/dL   Protein, ur 161100 (A) NEGATIVE mg/dL   Urobilinogen, UA 1.0 0.0 - 1.0 mg/dL   Nitrite NEGATIVE NEGATIVE   Leukocytes, UA NEGATIVE NEGATIVE  Protein / creatinine ratio, urine     Status: Abnormal   Collection Time: 10/14/14 10:53 AM  Result Value Ref Range   Creatinine, Urine 95.50 mg/dL   Total Protein, Urine 180.2 mg/dL   Protein  Creatinine Ratio 1.89 (H) 0.00 - 0.15  Urine microscopic-add on     Status: Abnormal   Collection Time: 10/14/14 10:53 AM  Result Value Ref Range   Squamous Epithelial / LPF FEW (A) RARE   WBC, UA 0-2 <3 WBC/hpf   RBC / HPF 0-2 <3 RBC/hpf   Bacteria, UA RARE RARE  CBC     Status: None   Collection Time: 10/14/14 12:10 PM  Result Value Ref Range   WBC 8.9 4.0 - 10.5 K/uL   RBC 4.24 3.87 - 5.11 MIL/uL   Hemoglobin 12.9 12.0 - 15.0 g/dL   HCT 09.636.6 04.536.0 - 40.946.0 %   MCV 86.3 78.0 - 100.0 fL   MCH 30.4 26.0 - 34.0 pg   MCHC 35.2 30.0 - 36.0 g/dL   RDW 81.114.8 91.411.5 - 78.215.5 %   Platelets 230 150 - 400 K/uL  Comprehensive metabolic panel     Status: Abnormal   Collection Time: 10/14/14 12:10 PM  Result Value Ref Range   Sodium 137 137 - 147 mEq/L   Potassium 3.9 3.7 - 5.3 mEq/L   Chloride 104 96 - 112 mEq/L   CO2 20 19 - 32 mEq/L   Glucose, Bld 72 70 - 99 mg/dL   BUN 7 6 - 23 mg/dL   Creatinine, Ser 9.560.64 0.50 - 1.10 mg/dL   Calcium 8.8 8.4 - 21.310.5 mg/dL   Total Protein 6.3 6.0 - 8.3 g/dL   Albumin 2.3 (L) 3.5 - 5.2 g/dL   AST 14 0 - 37 U/L   ALT 8 0 - 35 U/L   Alkaline Phosphatase 183 (H) 39 - 117 U/L   Total Bilirubin 0.3 0.3 - 1.2 mg/dL   GFR calc non Af Amer >90 >90 mL/min   GFR calc Af Amer >90 >90 mL/min   Anion gap 13 5 - 15  Lactate dehydrogenase     Status: None   Collection Time: 10/14/14 12:10 PM  Result Value Ref Range   LDH 134 94 - 250 U/L  Uric acid     Status: None   Collection Time: 10/14/14 12:10 PM  Result Value Ref Range   Uric Acid, Serum 5.2 2.4 - 7.0 mg/dL   US:  Vtx, EFW 5+3, 08%MVH15%ile, AC 5%ile, AFI 9.03, 16%ile, normal dopplers, BPP 8/8.  Consulted with Dr. Estanislado Pandyivard. Currently no severe features. D/C home on BR. PIH precautions reviewed and reinforced. May defer office visit tomorrow. Keep schedule for induction on Wednesday, 10/16/14. Patient to call with any issues or concerns.  Nigel BridgemanVicki Conner Muegge, CNM 10/14/14 3:45p

## 2014-10-15 ENCOUNTER — Telehealth (HOSPITAL_COMMUNITY): Payer: Self-pay | Admitting: *Deleted

## 2014-10-15 ENCOUNTER — Encounter (HOSPITAL_COMMUNITY): Payer: Self-pay | Admitting: *Deleted

## 2014-10-15 NOTE — Telephone Encounter (Signed)
Preadmission screen  

## 2014-10-16 ENCOUNTER — Encounter (HOSPITAL_COMMUNITY): Payer: Self-pay

## 2014-10-16 ENCOUNTER — Inpatient Hospital Stay (HOSPITAL_COMMUNITY)
Admission: AD | Admit: 2014-10-16 | Discharge: 2014-10-20 | DRG: 774 | Disposition: A | Discharge status: Home / Self Care | Payer: Medicaid Other | Source: Ambulatory Visit | Attending: Obstetrics and Gynecology | Admitting: Obstetrics and Gynecology

## 2014-10-16 DIAGNOSIS — O1403 Mild to moderate pre-eclampsia, third trimester: Principal | ICD-10-CM | POA: Diagnosis present

## 2014-10-16 DIAGNOSIS — O99334 Smoking (tobacco) complicating childbirth: Secondary | ICD-10-CM | POA: Diagnosis present

## 2014-10-16 DIAGNOSIS — Z825 Family history of asthma and other chronic lower respiratory diseases: Secondary | ICD-10-CM | POA: Diagnosis not present

## 2014-10-16 DIAGNOSIS — Z833 Family history of diabetes mellitus: Secondary | ICD-10-CM

## 2014-10-16 DIAGNOSIS — O99824 Streptococcus B carrier state complicating childbirth: Secondary | ICD-10-CM | POA: Diagnosis present

## 2014-10-16 DIAGNOSIS — O169 Unspecified maternal hypertension, unspecified trimester: Secondary | ICD-10-CM | POA: Insufficient documentation

## 2014-10-16 DIAGNOSIS — Z3A37 37 weeks gestation of pregnancy: Secondary | ICD-10-CM | POA: Diagnosis present

## 2014-10-16 DIAGNOSIS — F17210 Nicotine dependence, cigarettes, uncomplicated: Secondary | ICD-10-CM | POA: Diagnosis present

## 2014-10-16 LAB — CBC
HCT: 35 % — ABNORMAL LOW (ref 36.0–46.0)
Hemoglobin: 12.3 g/dL (ref 12.0–15.0)
MCH: 30.4 pg (ref 26.0–34.0)
MCHC: 35.1 g/dL (ref 30.0–36.0)
MCV: 86.4 fL (ref 78.0–100.0)
Platelets: 248 10*3/uL (ref 150–400)
RBC: 4.05 MIL/uL (ref 3.87–5.11)
RDW: 14.9 % (ref 11.5–15.5)
WBC: 10.8 10*3/uL — ABNORMAL HIGH (ref 4.0–10.5)

## 2014-10-16 LAB — TYPE AND SCREEN
ABO/RH(D): O POS
Antibody Screen: NEGATIVE

## 2014-10-16 MED ORDER — NALBUPHINE HCL 10 MG/ML IJ SOLN
5.0000 mg | INTRAMUSCULAR | Status: DC | PRN
  Administered 2014-10-17: 5 mg via INTRAVENOUS
  Filled 2014-10-16: qty 1

## 2014-10-16 MED ORDER — PENICILLIN G POTASSIUM 5000000 UNITS IJ SOLR
5.0000 10*6.[IU] | Freq: Once | INTRAVENOUS | Status: DC
  Filled 2014-10-16: qty 5

## 2014-10-16 MED ORDER — TERBUTALINE SULFATE 1 MG/ML IJ SOLN: 0.2500 mg | Freq: Once | INTRAMUSCULAR | Status: AC | PRN

## 2014-10-16 MED ORDER — LACTATED RINGERS IV SOLN
INTRAVENOUS | Status: DC
  Administered 2014-10-16 – 2014-10-17 (×2): via INTRAVENOUS

## 2014-10-16 MED ORDER — LIDOCAINE HCL (PF) 1 % IJ SOLN
30.0000 mL | INTRAMUSCULAR | Status: DC | PRN
  Filled 2014-10-16: qty 30

## 2014-10-16 MED ORDER — OXYTOCIN BOLUS FROM INFUSION
500.0000 mL | INTRAVENOUS | Status: DC
  Administered 2014-10-18: 500 mL via INTRAVENOUS

## 2014-10-16 MED ORDER — SODIUM CHLORIDE 0.9 % IJ SOLN: 3.0000 mL | Freq: Two times a day (BID) | INTRAMUSCULAR | Status: DC

## 2014-10-16 MED ORDER — LACTATED RINGERS IV SOLN: 500.0000 mL | INTRAVENOUS | Status: DC | PRN

## 2014-10-16 MED ORDER — SODIUM CHLORIDE 0.9 % IV SOLN: 250.0000 mL | INTRAVENOUS | Status: DC | PRN

## 2014-10-16 MED ORDER — SODIUM CHLORIDE 0.9 % IJ SOLN: 3.0000 mL | INTRAMUSCULAR | Status: DC | PRN

## 2014-10-16 MED ORDER — ONDANSETRON HCL 4 MG/2ML IJ SOLN
4.0000 mg | Freq: Four times a day (QID) | INTRAMUSCULAR | Status: DC | PRN
  Administered 2014-10-17: 4 mg via INTRAVENOUS
  Filled 2014-10-16: qty 2

## 2014-10-16 MED ORDER — ACETAMINOPHEN 325 MG PO TABS: 650.0000 mg | ORAL_TABLET | ORAL | Status: DC | PRN

## 2014-10-16 MED ORDER — OXYCODONE-ACETAMINOPHEN 5-325 MG PO TABS: 1.0000 | ORAL_TABLET | ORAL | Status: DC | PRN

## 2014-10-16 MED ORDER — CITRIC ACID-SODIUM CITRATE 334-500 MG/5ML PO SOLN: 30.0000 mL | ORAL | Status: DC | PRN

## 2014-10-16 MED ORDER — OXYTOCIN 40 UNITS IN LACTATED RINGERS INFUSION - SIMPLE MED: 1.0000 m[IU]/min | INTRAVENOUS | Status: DC

## 2014-10-16 MED ORDER — ZOLPIDEM TARTRATE 5 MG PO TABS
5.0000 mg | ORAL_TABLET | Freq: Once | ORAL | Status: AC
  Administered 2014-10-16: 5 mg via ORAL
  Filled 2014-10-16: qty 1

## 2014-10-16 MED ORDER — OXYTOCIN 40 UNITS IN LACTATED RINGERS INFUSION - SIMPLE MED: 62.5000 mL/h | INTRAVENOUS | Status: DC

## 2014-10-16 MED ORDER — OXYCODONE-ACETAMINOPHEN 5-325 MG PO TABS: 2.0000 | ORAL_TABLET | ORAL | Status: DC | PRN

## 2014-10-16 MED ORDER — PENICILLIN G POTASSIUM 5000000 UNITS IJ SOLR
2.5000 10*6.[IU] | INTRAVENOUS | Status: DC
  Filled 2014-10-16 (×2): qty 2.5

## 2014-10-16 MED ORDER — MISOPROSTOL 25 MCG QUARTER TABLET
25.0000 ug | ORAL_TABLET | ORAL | Status: DC | PRN
  Administered 2014-10-16 – 2014-10-17 (×3): 25 ug via VAGINAL
  Filled 2014-10-16 (×2): qty 0.25
  Filled 2014-10-16: qty 1
  Filled 2014-10-16: qty 0.25

## 2014-10-16 NOTE — H&P (Signed)
Anita HookBreanna Strickland is a 23 y.o. female, G2P0010 at 37.0 weeks, presenting for admission for IOL due to mild pre-eclampsia. She was seen in the office on 12/8 for a routine visit--BP was 120/90, 146/90. PIH labs were WNL, with PCR 2.68. She was seen at [facility] 12/9 for BP evaluation, with range 145-155/102-107. No severe features were noted.   She denies any severe HA, visual sx, or epigastric pain. Denies any contractions, reports +FM.   Evaluated in [facility] with labs, NST, cervical exam, PCR.   Patient Active Problem List   Diagnosis Date Noted  . Pre-eclampsia 10/10/2014  . Positive GBS test--10/08/14 10/10/2014  . Smoker 10/10/2014    History of present pregnancy: Patient entered care at 6 6/7 weeks, with US then for dating/viability. EDC of 11/06/14 Was established by US at 6 6/7 weeks.  Followed at [facility] until after visit at 14 weeks--she then moved to [place] until returning to [facility] at 31 6/7 weeks. She had not had a visit with any provider since 19 weeks upon her return to [facility].  Anatomy scan: 19 weeks, with normal findings per record. Additional US evaluations:  31 6/7 weeks: EFW 4+1, 36%ile, AFI 14.72, 50%ile, BPP 8/8, vtx. 35 6/7 weeks for S<D: EFW 5+15, 15%ile, BPD 7%ile. AFI 12. BPP 8/8.  Significant prenatal events: [facility], moved to [place] after 14 week visit. [facility]ed to [facility] at 31 6/7 weeks, with a lapse in care since 19 week US in [place]. Noted to have sciatica. Tinea cruris, also treated for vaginal yeast. BP 98/70 upon return to [facility], then 120/80 until visit at 32 6/7 weeks, when BP elevated. PIH labs 12/8 were WNL, but PCR was 2.68. Last evaluation: 10/14/14 at 36.5 weeks   OB History    Gravida Para Term Preterm AB TAB SAB Ectopic Multiple Living   2 0 0 0 1 0 1 0 0 0     ? Year: SAB  Past Medical History  Diagnosis Date  . Medical history non-contributory    Past Surgical  History  Procedure Laterality Date  . Abcess drainage      leg  . Esophagogastroduodenoscopy endoscopy     Family History: Brother with asthma; MGM diabetes.  Social History:  reports that she has been smoking Cigarettes. She has been smoking about 0.10 packs per day. She has never used smokeless tobacco. She reports that she drinks alcohol. She reports that she does not use illicit drugs. Patient is African American, of the [place] faith, has 2 years of college, employed in a factory. She is single, but FOB is involved and supportive.   Prenatal Transfer Tool  Maternal Diabetes: No Genetic Screening: Declined Maternal Ultrasounds/Referrals: Abnormal: Findings: IUGR--growth at 15%ile, BPD at 7%ile Fetal Ultrasounds or other Referrals: None Maternal Substance Abuse: Yes: Type: Smoker Significant Maternal Medications: None Significant Maternal Lab Results: Lab values include: Group B Strep positive  ROS: + FM, swelling of feet/legs  No Known Allergies  Dilation: Fingertip Effacement (%): Thick Station: -3 Exam by:: Anselma Herbel CNM   Chest clear Heart RRR without murmur Abd gravid, NT, FH 35 Pelvic: As above, cervix posterior Ext: DTR 2+, no clonus, +2 edema in LE  FHR: Category 1 UCs: Frequent, mild.  Prenatal labs: ABO, Rh: O/Positive/-- (10/25 0000) Antibody:  Neg Rubella:  Immune RPR:   Neg HBsAg:   Neg HIV:   NR GBS:  Positive 10/08/14 Sickle cell/Hgb electrophoresis: AA Pap: 04/11/14 WNL GC: Negative 03/01/14, 10/08/14 Chlamydia: Negative 03/01/14,  10/08/14 Genetic screenings: Declined Glucola: WNL Other: Hgb 12.9 at NOB, 12.4 at 34 weeks    Lab Results Last 24 Hours    Results for orders placed or performed during the hospital encounter of 10/10/14 (from the past 24 hour(s))  CBC Status: None   Collection Time: 10/10/14 4:07 PM  Result Value Ref Range   WBC 8.9 4.0 - 10.5 K/uL   RBC 4.40 3.87 - 5.11  MIL/uL   Hemoglobin 13.1 12.0 - 15.0 g/dL   HCT 16.137.8 09.636.0 - 04.546.0 %   MCV 85.9 78.0 - 100.0 fL   MCH 29.8 26.0 - 34.0 pg   MCHC 34.7 30.0 - 36.0 g/dL   RDW 40.914.8 81.111.5 - 91.415.5 %   Platelets 259 150 - 400 K/uL  Comprehensive metabolic panel Status: Abnormal   Collection Time: 10/10/14 4:07 PM  Result Value Ref Range   Sodium 135 (L) 137 - 147 mEq/L   Potassium 3.6 (L) 3.7 - 5.3 mEq/L   Chloride 101 96 - 112 mEq/L   CO2 21 19 - 32 mEq/L   Glucose, Bld 120 (H) 70 - 99 mg/dL   BUN 5 (L) 6 - 23 mg/dL   Creatinine, Ser 7.820.71 0.50 - 1.10 mg/dL   Calcium 9.2 8.4 - 95.610.5 mg/dL   Total Protein 6.3 6.0 - 8.3 g/dL   Albumin 2.5 (L) 3.5 - 5.2 g/dL   AST 12 0 - 37 U/L   ALT 8 0 - 35 U/L   Alkaline Phosphatase 181 (H) 39 - 117 U/L   Total Bilirubin 0.4 0.3 - 1.2 mg/dL   GFR calc non Af Amer >90 >90 mL/min   GFR calc Af Amer >90 >90 mL/min   Anion gap 13 5 - 15  Uric acid Status: None   Collection Time: 10/10/14 4:07 PM  Result Value Ref Range   Uric Acid, Serum 5.4 2.4 - 7.0 mg/dL  Lactate dehydrogenase Status: None   Collection Time: 10/10/14 4:07 PM  Result Value Ref Range   LDH 163 94 - 250 U/L  Protein / creatinine ratio, urine Status: Abnormal   Collection Time: 10/10/14 4:09 PM  Result Value Ref Range   Creatinine, Urine 230.57 mg/dL   Total Protein, Urine 433.7 mg/dL   Protein Creatinine Ratio 1.88 (H) 0.00 - 0.15      Assessment:  IUP at 37.0 weeks NICHD: Category 1 Membranes: intact Bishop Score: 0 GBS positive Diagnosis: pre eclampsia  Plan:  Admit to L&D for IOL d/t IOL options reviewed with patient including cytotec, foley bulb, AROM, and pitocin R&B of IOL reviewed including serial induction, failure, and/or CS requirement Pt and family verbalize understanding and agree with treatment plan.  Start induction  with cytotec PV GBS prophylaxis with PCN G per Sylvi Rybolt dosing with onset of active labor.  Okay to ambulate around unit with wireless monitors  Okay to get up and shower without monitoring  Regular diet prior to starting pitocin Clear/Thin diet after pitocin starts  Will place next dose cytotec or foley bulb at 0030  Continue with labor mgmt as ordered  IV pain medication per orders PRN Epidural per patient request Foley cath after patient is comfortable with epidural  Anticipate SVD  Attending MD available at all times.   Velvia Mehrer, CNM, MSN @TD @, @NOW @

## 2014-10-17 ENCOUNTER — Encounter (HOSPITAL_COMMUNITY): Payer: Self-pay

## 2014-10-17 ENCOUNTER — Inpatient Hospital Stay (HOSPITAL_COMMUNITY): Payer: Medicaid Other | Admitting: Anesthesiology

## 2014-10-17 LAB — COMPREHENSIVE METABOLIC PANEL
ALBUMIN: 2.3 g/dL — AB (ref 3.5–5.2)
ALT: 7 U/L (ref 0–35)
AST: 12 U/L (ref 0–37)
Alkaline Phosphatase: 195 U/L — ABNORMAL HIGH (ref 39–117)
Anion gap: 12 (ref 5–15)
BILIRUBIN TOTAL: 0.4 mg/dL (ref 0.3–1.2)
BUN: 5 mg/dL — AB (ref 6–23)
CHLORIDE: 101 meq/L (ref 96–112)
CO2: 21 mEq/L (ref 19–32)
CREATININE: 0.71 mg/dL (ref 0.50–1.10)
Calcium: 9.1 mg/dL (ref 8.4–10.5)
GFR calc Af Amer: >90 mL/min (ref >90)
GFR calc non Af Amer: >90 mL/min (ref >90)
Glucose, Bld: 91 mg/dL (ref 70–99)
Potassium: 3.9 mEq/L (ref 3.7–5.3)
Sodium: 134 mEq/L — ABNORMAL LOW (ref 137–147)
TOTAL PROTEIN: 6.2 g/dL (ref 6.0–8.3)

## 2014-10-17 LAB — CBC
HEMATOCRIT: 35.8 % — AB (ref 36.0–46.0)
Hemoglobin: 12.2 g/dL (ref 12.0–15.0)
MCH: 29.5 pg (ref 26.0–34.0)
MCHC: 34.1 g/dL (ref 30.0–36.0)
MCV: 86.5 fL (ref 78.0–100.0)
PLATELETS: 244 10*3/uL (ref 150–400)
RBC: 4.14 MIL/uL (ref 3.87–5.11)
RDW: 14.9 % (ref 11.5–15.5)
WBC: 9.7 10*3/uL (ref 4.0–10.5)

## 2014-10-17 LAB — URIC ACID: Uric Acid, Serum: 5.3 mg/dL (ref 2.4–7.0)

## 2014-10-17 LAB — LACTATE DEHYDROGENASE: LDH: 133 U/L (ref 94–250)

## 2014-10-17 LAB — HIV ANTIBODY (ROUTINE TESTING W REFLEX): HIV 1&2 Ab, 4th Generation: NONREACTIVE

## 2014-10-17 LAB — RPR

## 2014-10-17 MED ORDER — FENTANYL 2.5 MCG/ML BUPIVACAINE 1/10 % EPIDURAL INFUSION (WH - ANES)
INTRAMUSCULAR | Status: AC
  Administered 2014-10-17: 14 mL/h via EPIDURAL
  Filled 2014-10-17: qty 125

## 2014-10-17 MED ORDER — PENICILLIN G POTASSIUM 5000000 UNITS IJ SOLR
2.5000 10*6.[IU] | INTRAVENOUS | Status: DC
  Administered 2014-10-17 – 2014-10-18 (×3): 2.5 10*6.[IU] via INTRAVENOUS
  Filled 2014-10-17 (×7): qty 2.5

## 2014-10-17 MED ORDER — LIDOCAINE HCL (PF) 1 % IJ SOLN
INTRAMUSCULAR | Status: DC | PRN
  Administered 2014-10-17 (×2): 8 mL

## 2014-10-17 MED ORDER — EPHEDRINE 5 MG/ML INJ
10.0000 mg | INTRAVENOUS | Status: DC | PRN
  Filled 2014-10-17: qty 2

## 2014-10-17 MED ORDER — PENICILLIN G POTASSIUM 5000000 UNITS IJ SOLR: 2.5000 10*6.[IU] | INTRAMUSCULAR | Status: DC

## 2014-10-17 MED ORDER — MAGNESIUM SULFATE 40 G IN LACTATED RINGERS - SIMPLE
2.0000 g/h | INTRAVENOUS | Status: AC
Start: 2014-10-17 — End: 2014-10-19
  Administered 2014-10-18: 2 g/h via INTRAVENOUS
  Filled 2014-10-17 (×2): qty 500

## 2014-10-17 MED ORDER — PENICILLIN G POTASSIUM 5000000 UNITS IJ SOLR
5.0000 10*6.[IU] | Freq: Once | INTRAMUSCULAR | Status: DC
  Filled 2014-10-17: qty 5

## 2014-10-17 MED ORDER — OXYTOCIN 40 UNITS IN LACTATED RINGERS INFUSION - SIMPLE MED
1.0000 m[IU]/min | INTRAVENOUS | Status: DC
  Administered 2014-10-17: 1 m[IU]/min via INTRAVENOUS
  Filled 2014-10-17: qty 1000

## 2014-10-17 MED ORDER — LABETALOL HCL 5 MG/ML IV SOLN
10.0000 mg | INTRAVENOUS | Status: DC | PRN
  Administered 2014-10-17 (×2): 10 mg via INTRAVENOUS
  Filled 2014-10-17: qty 4

## 2014-10-17 MED ORDER — MAGNESIUM SULFATE BOLUS VIA INFUSION
4.0000 g | Freq: Once | INTRAVENOUS | Status: AC
  Administered 2014-10-17: 4 g via INTRAVENOUS
  Filled 2014-10-17: qty 500

## 2014-10-17 MED ORDER — PHENYLEPHRINE 40 MCG/ML (10ML) SYRINGE FOR IV PUSH (FOR BLOOD PRESSURE SUPPORT)
80.0000 ug | PREFILLED_SYRINGE | INTRAVENOUS | Status: DC | PRN
  Filled 2014-10-17: qty 2

## 2014-10-17 MED ORDER — FENTANYL 2.5 MCG/ML BUPIVACAINE 1/10 % EPIDURAL INFUSION (WH - ANES)
14.0000 mL/h | INTRAMUSCULAR | Status: DC | PRN
  Administered 2014-10-17 – 2014-10-18 (×2): 14 mL/h via EPIDURAL
  Filled 2014-10-17: qty 125

## 2014-10-17 MED ORDER — FENTANYL 2.5 MCG/ML BUPIVACAINE 1/10 % EPIDURAL INFUSION (WH - ANES)
INTRAMUSCULAR | Status: DC | PRN
  Administered 2014-10-17: 14 mL/h via EPIDURAL

## 2014-10-17 MED ORDER — LACTATED RINGERS IV SOLN
500.0000 mL | Freq: Once | INTRAVENOUS | Status: AC
  Administered 2014-10-17: 500 mL via INTRAVENOUS

## 2014-10-17 MED ORDER — PHENYLEPHRINE 40 MCG/ML (10ML) SYRINGE FOR IV PUSH (FOR BLOOD PRESSURE SUPPORT)
PREFILLED_SYRINGE | INTRAVENOUS | Status: AC
  Filled 2014-10-17: qty 10

## 2014-10-17 MED ORDER — DIPHENHYDRAMINE HCL 50 MG/ML IJ SOLN: 12.5000 mg | INTRAMUSCULAR | Status: DC | PRN

## 2014-10-17 MED ORDER — PENICILLIN G POTASSIUM 5000000 UNITS IJ SOLR
5.0000 10*6.[IU] | Freq: Once | INTRAVENOUS | Status: AC
  Administered 2014-10-17: 5 10*6.[IU] via INTRAVENOUS
  Filled 2014-10-17: qty 5

## 2014-10-17 NOTE — Progress Notes (Signed)
  Subjective: Assuming care of this 23 yo G2P0010 @ 37.1 wks who was admitted on 10/16/14 for IOL due to preeclampsia w/o severe features. Denies h/a, visual disturbances, RUQ pain, CP, SOB, weakness, LOF, N/V, VB or feeling ctxs. Comfortable w/ epidural, however feels constant pressure. Reports active fetus. FOB at bedside and very supportive.  Objective: BP 136/90 mmHg  Pulse 62  Temp(Src) 98.1 F (36.7 C) (Oral)  Resp 20  Ht 5' 2.5" (1.588 m)  Wt 181 lb (82.101 kg)  BMI 32.56 kg/m2  SpO2 100%  LMP 12/02/2013   BPs over past 24 hrs = 120-170/80-114. Pt required 2 doses of IV Labetalol --4:39 pm and 5:39 pm. Stable BPs s/p epidural. No further Labetalol has been required. Received Epidural around 6:10 PM.  Gen: In good spirits. Lungs: CTAB. CV: RRR w/o murmur. Abdomen: gravid, soft between ctxs, NT, non-distended. Pelvic: Spec used to place intracervical balloon. Placed at 1944 w/o event. Cervical exam deferred. Cervix w/o lesions. No bleeding in vault or from cervix.  FHT: BL 135 w/ moderate variability, +accels, variables/lates (due to position).  UC:   irregular, every 1-3 minutes; tachysystole. Ext: 2+ pitting edema to feet, 2+ brachial DTRs.   Pitocin at 10 mius/min. Urinary foley cath placed; 125 ml urine return  Assessment:  IUP at 37.1 wks IOL due to preeclampsia Asymptomatic GBS positive Cat 2 FHRT (tachysystole)   Plan: Will begin Magnesium Sulfate per off going and incoming MDs recs. Discussed use of magnesium for seizure prophylaxis with pt and FOB. Both pt and FOB understand recommendation and concurs with proceeding. Preeclampsia labs in am per Dr. Estanislado Pandyivard. Vigilant for magnesium toxicity. Pitocin off for now due to tachysystole. Intrauterine resuscitative measures in place. Monitor FHRT closely. Consult prn. Expect progress and SVD.  Sherre ScarletWILLIAMS, Belton Peplinski CNM 10/17/2014, 7:53 PM

## 2014-10-17 NOTE — Progress Notes (Addendum)
Labor Progress  Subjective: Pt sleeping on her left side, FOB sleep on the couch  Objective: BP 144/92 mmHg  Pulse 76  Temp(Src) 97.9 F (36.6 C) (Oral)  Resp 20  Ht 5' 2.5" (1.588 m)  Wt 181 lb (82.101 kg)  BMI 32.56 kg/m2  LMP 12/02/2013      Filed Vitals:   10/17/14 0039 10/17/14 0137 10/17/14 0330 10/17/14 0430  BP: 137/85   144/92  Pulse: 103   76  Temp: 98.1 F (36.7 C)   97.9 F (36.6 C)  TempSrc: Oral   Oral  Resp:  18 18 20   Height:      Weight:       FHT: 135, moderate variability, + accel, no decels CTX:  irregular, every 2-8 minutes Uterus gravid, soft non tender SVE:  Dilation: Fingertip Effacement (%): Thick Station: -3 Exam by:: B Heritage managerBoyer RN   Assessment:  IUP at 37.1 weeks IOL for pre eclampsia NICHD: Category 1 Membranes:  intact Labor progress: IOL GBS: positive Cytotec #3 placed   Plan: Continue labor plan Continuous monitoring Rest/Ambulate Frequent position changes to facilitate fetal rotation and descent.     Avryl Roehm, CNM, MSN 10/17/2014. 4:35 AM

## 2014-10-17 NOTE — Progress Notes (Signed)
  Subjective: Epidural just placed.  Patient getting comfortable.  Objective: BP 120/83 mmHg  Pulse 66  Temp(Src) 98 F (36.7 C) (Oral)  Resp 20  Ht 5' 2.5" (1.588 m)  Wt 181 lb (82.101 kg)  BMI 32.56 kg/m2  SpO2 100%  LMP 12/02/2013      FHT: Category 1 UC:   Irregular SVE:   Dilation: 1 Effacement (%): 70 Station: -2 Exam by:: Manfred ArchV. Maika Mcelveen, CNM Pitocin at 9 mu/min  Assessment:  Induction for pre-eclampsia GBS positive Early labor  Plan: Reviewed status with patient and partner. Once comfortable, will plan foley bulb, continue pitocin, AROM as cervix advances.  Nigel BridgemanLATHAM, Naveya Ellerman CNM 10/17/2014, 6:41 PM

## 2014-10-17 NOTE — Progress Notes (Signed)
  Subjective: Patient much more uncomfortable.  Requesting epidural.  Family and partner at bedside.  Objective: BP 158/105 mmHg  Pulse 87  Temp(Src) 98 F (36.7 C) (Oral)  Resp 14  Ht 5' 2.5" (1.588 m)  Wt 181 lb (82.101 kg)  BMI 32.56 kg/m2  LMP 12/02/2013      Filed Vitals:   10/17/14 1700 10/17/14 1715 10/17/14 1730 10/17/14 1732  BP: 151/88 149/97 170/96 158/105  Pulse: 67 63 76 87  Temp:      TempSrc:      Resp: 20  14   Height:      Weight:       Has received 2 doses of IV Labetalol 10 mg, at 1639 and 1739.  CBC    Component Value Date/Time   WBC 9.7 10/17/2014 1300   RBC 4.14 10/17/2014 1300   HGB 12.2 10/17/2014 1300   HCT 35.8* 10/17/2014 1300   PLT 244 10/17/2014 1300   MCV 86.5 10/17/2014 1300   MCH 29.5 10/17/2014 1300   MCHC 34.1 10/17/2014 1300   RDW 14.9 10/17/2014 1300   CMP     Component Value Date/Time   NA 134* 10/17/2014 1300   K 3.9 10/17/2014 1300   CL 101 10/17/2014 1300   CO2 21 10/17/2014 1300   GLUCOSE 91 10/17/2014 1300   BUN 5* 10/17/2014 1300   CREATININE 0.71 10/17/2014 1300   CALCIUM 9.1 10/17/2014 1300   PROT 6.2 10/17/2014 1300   ALBUMIN 2.3* 10/17/2014 1300   AST 12 10/17/2014 1300   ALT 7 10/17/2014 1300   ALKPHOS 195* 10/17/2014 1300   BILITOT 0.4 10/17/2014 1300   GFRNONAA >90 10/17/2014 1300   GFRAA >90 10/17/2014 1300   Uric acid 5.3 LDH 133   FHT: Category 1 UC:   irregular, every 3-6 minutes SVE:   Dilation: 1 Effacement (%): 70 Station: -2 Exam by:: Manfred ArchV. Shamonica Schadt, CNM  Cervix still posterior, bloody show noted Pitocin at 9 mu/min  Assessment:  Early labor GBS positive Pre-eclampsia.  Plan: Place epidural--expect BP benefit Consider foley bulb placement after epidural placed.  Nigel BridgemanLATHAM, Isys Tietje CNM 10/17/2014, 5:48 PM

## 2014-10-17 NOTE — Anesthesia Procedure Notes (Signed)
Epidural Patient location during procedure: OB Start time: 10/17/2014 5:59 PM End time: 10/17/2014 6:03 PM  Staffing Anesthesiologist: Leilani AbleHATCHETT, Callyn Severtson Performed by: anesthesiologist   Preanesthetic Checklist Completed: patient identified, surgical consent, pre-op evaluation, timeout performed, IV checked, risks and benefits discussed and monitors and equipment checked  Epidural Patient position: sitting Prep: site prepped and draped and DuraPrep Patient monitoring: continuous pulse ox and blood pressure Approach: midline Location: L3-L4 Injection technique: LOR air  Needle:  Needle type: Tuohy  Needle gauge: 17 G Needle length: 9 cm and 9 Needle insertion depth: 7 cm Catheter type: closed end flexible Catheter size: 19 Gauge Catheter at skin depth: 13 cm Test dose: negative and Other  Assessment Sensory level: T9 Events: blood not aspirated, injection not painful, no injection resistance, negative IV test and no paresthesia  Additional Notes Reason for block:procedure for pain

## 2014-10-17 NOTE — Progress Notes (Signed)
  Subjective: Completed light breakfast and shower.  Still aware of persistent cramping.  Denies HA, visual sx, or epigastric pain.  Objective: BP 147/96 mmHg  Pulse 68  Temp(Src) 97.9 F (36.6 C) (Oral)  Resp 16  Ht 5' 2.5" (1.588 m)  Wt 181 lb (82.101 kg)  BMI 32.56 kg/m2  LMP 12/02/2013   Filed Vitals:   10/17/14 0530 10/17/14 0902 10/17/14 1046 10/17/14 1159  BP:  154/96 146/79 147/96  Pulse:  79 71 68  Temp:  98.9 F (37.2 C)  97.9 F (36.6 C)  TempSrc:  Oral  Oral  Resp: 18 16 14 16   Height:      Weight:            FHT: Category 1 UC:   Frequent irritability SVE:   Dilation: 1 Effacement (%): 70 Station: -3 Exam by:: Manfred ArchV. Kristoff Coonradt, CNM  Cervix very posterior. Attempted foley bulb placement with and without speculum.  Unable to insert due to position of cervix.   Assessment:  Induction for pre-eclampsia GBS positive  Plan: Will start pitocin, consider foley bulb insertion as labor advances. Repeat PIH labs now. Per consult with Dr. Stefano GaulStringer, will plan magnesium pp. Start GBS prophylaxis.  Nigel BridgemanLATHAM, Ewell Benassi CNM 10/17/2014, 12:19 PM

## 2014-10-17 NOTE — Progress Notes (Signed)
  Subjective: Slept after dose of Nubain at 0631 given for cramping.  Now awake, aware of cramping, but tolerating without difficulty.  Partner at bedside.  Objective: BP 154/96 mmHg  Pulse 79  Temp(Src) 98.9 F (37.2 C) (Oral)  Resp 16  Ht 5' 2.5" (1.588 m)  Wt 181 lb (82.101 kg)  BMI 32.56 kg/m2  LMP 12/02/2013   Filed Vitals:   10/17/14 0330 10/17/14 0430 10/17/14 0530 10/17/14 0902  BP:  144/92  154/96  Pulse:  76  79  Temp:  97.9 F (36.6 C)  98.9 F (37.2 C)  TempSrc:  Oral  Oral  Resp: 18 20 18 16   Height:      Weight:          FHT: Category 1 UC:   Frequent irritability SVE:  Deferred at present Last dose Cytotech at 0430     Assessment:  IUP at 37 1/7 weeks Mild pre-eclampsia GBS positive  Plan: Reviewed options for next step in induction--cytotech, pitocin, and/or foley bulb. Will allow patient to eat light meal, shower, then I will check cervix and determine next step in plan. Will consult with Dr. Stefano GaulStringer regarding use of magnesium sulfate during labor/pp. Labetalol IV for BP parameters Plan repeat PIH labs today. Patient plans epidural for labor. Will start GBS prophylaxis with onset of active labor or initiation of pitocin.  Nigel BridgemanLATHAM, Avonte Sensabaugh CNM 10/17/2014, 9:55 AM

## 2014-10-17 NOTE — Progress Notes (Signed)
Labor Progress  Subjective: Comfortable, no complaints  Objective: BP 137/85 mmHg  Pulse 103  Temp(Src) 98.1 F (36.7 C) (Oral)  Resp 18  Ht 5' 2.5" (1.588 m)  Wt 181 lb (82.101 kg)  BMI 32.56 kg/m2  LMP 12/02/2013     FHT: 130, moderate variability, + accel, no decel CTX:  occasional Uterus gravid, soft non tender SVE:  Dilation: Fingertip Effacement (%): Thick Station: -3 Exam by:: B Heritage managerBoyer RN   Assessment:  IUP at 37.1 weeks NICHD: Category 1 Membranes:  intact Labor progress:early GBS: positive   Plan: Continue labor plan Continuous monitoring Rest/Ambulate Frequent position changes to facilitate fetal rotation and descent. Will reassess with cervical exam at 0500 or earlier if necessary Place second dose of cytotec    Val Schiavo, CNM, MSN 10/17/2014. 12:51 AM

## 2014-10-17 NOTE — Anesthesia Preprocedure Evaluation (Signed)
Anesthesia Evaluation  Patient identified by MRN, date of birth, ID band Patient awake    Reviewed: Allergy & Precautions, H&P , NPO status , Patient's Chart, lab work & pertinent test results  Airway Mallampati: I  TM Distance: >3 FB Neck ROM: full    Dental no notable dental hx.    Pulmonary Current Smoker,  breath sounds clear to auscultation  Pulmonary exam normal       Cardiovascular     Neuro/Psych negative neurological ROS  negative psych ROS   GI/Hepatic negative GI ROS, Neg liver ROS,   Endo/Other  negative endocrine ROS  Renal/GU negative Renal ROS     Musculoskeletal   Abdominal Normal abdominal exam  (+)   Peds  Hematology negative hematology ROS (+)   Anesthesia Other Findings   Reproductive/Obstetrics (+) Pregnancy                             Anesthesia Physical Anesthesia Plan  ASA: II  Anesthesia Plan: Epidural   Post-op Pain Management:    Induction:   Airway Management Planned:   Additional Equipment:   Intra-op Plan:   Post-operative Plan:   Informed Consent: I have reviewed the patients History and Physical, chart, labs and discussed the procedure including the risks, benefits and alternatives for the proposed anesthesia with the patient or authorized representative who has indicated his/her understanding and acceptance.     Plan Discussed with:   Anesthesia Plan Comments:         Anesthesia Quick Evaluation

## 2014-10-18 ENCOUNTER — Encounter (HOSPITAL_COMMUNITY): Payer: Self-pay

## 2014-10-18 LAB — CBC
HEMATOCRIT: 37 % (ref 36.0–46.0)
HEMOGLOBIN: 12.7 g/dL (ref 12.0–15.0)
MCH: 29.7 pg (ref 26.0–34.0)
MCHC: 34.3 g/dL (ref 30.0–36.0)
MCV: 86.4 fL (ref 78.0–100.0)
Platelets: 253 10*3/uL (ref 150–400)
RBC: 4.28 MIL/uL (ref 3.87–5.11)
RDW: 15 % (ref 11.5–15.5)
WBC: 17.2 10*3/uL — ABNORMAL HIGH (ref 4.0–10.5)

## 2014-10-18 LAB — MRSA PCR SCREENING: MRSA by PCR: NEGATIVE

## 2014-10-18 LAB — COMPREHENSIVE METABOLIC PANEL
ALBUMIN: 2.2 g/dL — AB (ref 3.5–5.2)
ALT: 7 U/L (ref 0–35)
ANION GAP: 12 (ref 5–15)
AST: 17 U/L (ref 0–37)
Alkaline Phosphatase: 200 U/L — ABNORMAL HIGH (ref 39–117)
BILIRUBIN TOTAL: 0.3 mg/dL (ref 0.3–1.2)
BUN: 7 mg/dL (ref 6–23)
CHLORIDE: 102 meq/L (ref 96–112)
CO2: 21 mEq/L (ref 19–32)
CREATININE: 0.89 mg/dL (ref 0.50–1.10)
Calcium: 8.8 mg/dL (ref 8.4–10.5)
GFR calc non Af Amer: >90 mL/min (ref >90)
GLUCOSE: 103 mg/dL — AB (ref 70–99)
POTASSIUM: 4.4 meq/L (ref 3.7–5.3)
Sodium: 135 mEq/L — ABNORMAL LOW (ref 137–147)
TOTAL PROTEIN: 5.8 g/dL — AB (ref 6.0–8.3)

## 2014-10-18 LAB — URIC ACID: Uric Acid, Serum: 5.4 mg/dL (ref 2.4–7.0)

## 2014-10-18 LAB — MAGNESIUM: Magnesium: 5.6 mg/dL — ABNORMAL HIGH (ref 1.5–2.5)

## 2014-10-18 LAB — LACTATE DEHYDROGENASE: LDH: 192 U/L (ref 94–250)

## 2014-10-18 MED ORDER — TETANUS-DIPHTH-ACELL PERTUSSIS 5-2.5-18.5 LF-MCG/0.5 IM SUSP: 0.5000 mL | Freq: Once | INTRAMUSCULAR | Status: DC

## 2014-10-18 MED ORDER — LANOLIN HYDROUS EX OINT: TOPICAL_OINTMENT | CUTANEOUS | Status: DC | PRN

## 2014-10-18 MED ORDER — WITCH HAZEL-GLYCERIN EX PADS
1.0000 | MEDICATED_PAD | CUTANEOUS | Status: DC | PRN
Start: 2014-10-18 — End: 2014-10-20

## 2014-10-18 MED ORDER — ONDANSETRON HCL 4 MG PO TABS: 4.0000 mg | ORAL_TABLET | ORAL | Status: DC | PRN

## 2014-10-18 MED ORDER — ZOLPIDEM TARTRATE 5 MG PO TABS: 5.0000 mg | ORAL_TABLET | Freq: Every evening | ORAL | Status: DC | PRN

## 2014-10-18 MED ORDER — ONDANSETRON HCL 4 MG/2ML IJ SOLN: 4.0000 mg | INTRAMUSCULAR | Status: DC | PRN

## 2014-10-18 MED ORDER — PRENATAL MULTIVITAMIN CH: 1.0000 | ORAL_TABLET | Freq: Every day | ORAL | Status: DC

## 2014-10-18 MED ORDER — WITCH HAZEL-GLYCERIN EX PADS: 1.0000 "application " | MEDICATED_PAD | CUTANEOUS | Status: DC | PRN

## 2014-10-18 MED ORDER — IBUPROFEN 600 MG PO TABS
600.0000 mg | ORAL_TABLET | Freq: Four times a day (QID) | ORAL | Status: DC
  Administered 2014-10-18 – 2014-10-20 (×10): 600 mg via ORAL
  Filled 2014-10-18 (×10): qty 1

## 2014-10-18 MED ORDER — DIBUCAINE 1 % RE OINT: 1.0000 "application " | TOPICAL_OINTMENT | RECTAL | Status: DC | PRN

## 2014-10-18 MED ORDER — OXYCODONE-ACETAMINOPHEN 5-325 MG PO TABS: 2.0000 | ORAL_TABLET | ORAL | Status: DC | PRN

## 2014-10-18 MED ORDER — OXYCODONE-ACETAMINOPHEN 5-325 MG PO TABS
1.0000 | ORAL_TABLET | ORAL | Status: DC | PRN
  Administered 2014-10-18: 1 via ORAL
  Filled 2014-10-18: qty 1

## 2014-10-18 MED ORDER — DIBUCAINE 1 % RE OINT
1.0000 | TOPICAL_OINTMENT | RECTAL | Status: DC | PRN
Start: 2014-10-18 — End: 2014-10-20

## 2014-10-18 MED ORDER — BENZOCAINE-MENTHOL 20-0.5 % EX AERO: 1.0000 "application " | INHALATION_SPRAY | CUTANEOUS | Status: DC | PRN

## 2014-10-18 MED ORDER — TETANUS-DIPHTH-ACELL PERTUSSIS 5-2.5-18.5 LF-MCG/0.5 IM SUSP
0.5000 mL | Freq: Once | INTRAMUSCULAR | Status: DC
  Filled 2014-10-18: qty 0.5

## 2014-10-18 MED ORDER — LACTATED RINGERS IV SOLN
INTRAVENOUS | Status: DC
  Administered 2014-10-18 (×2): via INTRAVENOUS

## 2014-10-18 MED ORDER — SENNOSIDES-DOCUSATE SODIUM 8.6-50 MG PO TABS: 2.0000 | ORAL_TABLET | ORAL | Status: DC

## 2014-10-18 MED ORDER — IBUPROFEN 600 MG PO TABS: 600.0000 mg | ORAL_TABLET | Freq: Four times a day (QID) | ORAL | Status: DC

## 2014-10-18 MED ORDER — DIPHENHYDRAMINE HCL 25 MG PO CAPS: 25.0000 mg | ORAL_CAPSULE | Freq: Four times a day (QID) | ORAL | Status: DC | PRN

## 2014-10-18 MED ORDER — SIMETHICONE 80 MG PO CHEW: 80.0000 mg | CHEWABLE_TABLET | ORAL | Status: DC | PRN

## 2014-10-18 MED ORDER — SENNOSIDES-DOCUSATE SODIUM 8.6-50 MG PO TABS
2.0000 | ORAL_TABLET | ORAL | Status: DC
  Administered 2014-10-19 (×2): 2 via ORAL
  Filled 2014-10-18: qty 2

## 2014-10-18 MED ORDER — FERROUS SULFATE 325 (65 FE) MG PO TABS
325.0000 mg | ORAL_TABLET | Freq: Two times a day (BID) | ORAL | Status: DC
  Administered 2014-10-18 – 2014-10-20 (×5): 325 mg via ORAL
  Filled 2014-10-18 (×5): qty 1

## 2014-10-18 MED ORDER — PRENATAL MULTIVITAMIN CH
1.0000 | ORAL_TABLET | Freq: Every day | ORAL | Status: DC
  Administered 2014-10-18 – 2014-10-20 (×3): 1 via ORAL
  Filled 2014-10-18 (×3): qty 1

## 2014-10-18 MED ORDER — OXYCODONE-ACETAMINOPHEN 5-325 MG PO TABS: 1.0000 | ORAL_TABLET | ORAL | Status: DC | PRN

## 2014-10-18 NOTE — Progress Notes (Signed)
  Subjective: Feeling pressure  Objective: BP 147/81 mmHg  Pulse 98  Temp(Src) 98.2 F (36.8 C) (Oral)  Resp 16  Ht 5' 2.5" (1.588 m)  Wt 181 lb (82.101 kg)  BMI 32.56 kg/m2  SpO2 100%  LMP 12/02/2013   Total I/O In: 4785.7 [P.O.:240; I.V.:4245.7; IV Piggyback:300] Out: 1010 [Urine:1010]  FHT: Category 1 UC:   irregular, every 2-5 minutes - not picking up well SVE:   Dilation: 5 Effacement (%): 100 Station: +1 Exam by:: K. Fraley & B. Boyer RN Pitocin at 5 mius/min  Assessment:  IUP at 37.2 wks IOL due to preeclampsia Cat 1 FHRT GBS positive Progressive labor  Plan: IUPC w/ next exam due to inability to adequate trace ctxs externally Expect progress and SVD  Sherre ScarletWILLIAMS, Macoy Rodwell CNM 10/18/2014, 3:42 AM

## 2014-10-18 NOTE — Lactation Note (Signed)
This note was copied from the chart of Anita Strickland. Lactation Consultation Note  Patient Name: Anita Strickland ZOXWR'UToday's Date: 10/18/2014 Reason for consult: Initial assessment; < 6 lbs  Initial consult for mom in AICU but mom was sleeping when LC entered room.  LC did not awaken mom but spoke with RN.  RN stated infant is breastfeeding well.  Infant is a 37.[redacted] week GA, 5 lbs, 3.4 oz BW.  Infant is 9 hrs old and has breastfed x4 (10-20 min) with last LS -9 by RN; voids-1; stools-1.  Maternal history of GBS+, and HTN.  Parents plan for outpatient circumcision.  Based on successful breastfeeding thus far, LC told RN will follow-up with mom tomorrow, but left spoons and curved-tip syringe for RN to assist mom with hand expression and EBM spoon feedings if colostrum available since infant is an early term, <6lb infant but is breastfeeding well.  Will follow-up with mom at a later date.    Consult Status Consult Status: Follow-up Date: 10/19/14 Follow-up type: In-patient    Lendon KaVann, Kha Hari Walker 10/18/2014, 1:46 PM

## 2014-10-18 NOTE — Progress Notes (Signed)
  Subjective: SROM'd at 2143, clear fluid. Comfortable despite continued pressure. Denies preeclampsia sxs. FOB remains at bedside.  Objective: BP 135/82 mmHg  Pulse 80  Temp(Src) 97.9 F (36.6 C) (Oral)  Resp 18  Ht 5' 2.5" (1.588 m)  Wt 181 lb (82.101 kg)  BMI 32.56 kg/m2  SpO2 100%  LMP 12/02/2013    BP range 130-140/80-90  Total I/O In: 4248.9 [P.O.:180; I.V.:3868.9; IV Piggyback:200] Out: 600 [Urine:600]  FHT: BL 118 w/ moderate variability, +accels, no decels UC:   irregular, every 1-4 minutes, mostly irritability SVE: Deferred    Pitocin at 3 mius/min. Pit restarted at 2115 at 1 miu/min Magnesium 4 g bolus started at 2012 with subsequent 2 g continuous infusion starting at 2031 Intracervical foley still intact and taut despite SROM  Assessment:  IUP at 37.2 wks, IOL for preeclampsia SROM Cat 1 FHRT GBS positive  Plan: Continue current plan Consult prn  Sherre ScarletWILLIAMS, Anita Strickland CNM 10/18/2014, 12:06 AM

## 2014-10-18 NOTE — Anesthesia Postprocedure Evaluation (Signed)
Anesthesia Post Note  Patient: Anita Strickland  Procedure(s) Performed: * No procedures listed *  Anesthesia type: Epidural  Patient location: Mother/Baby  Post pain: Pain level controlled  Post assessment: Post-op Vital signs reviewed  Last Vitals:  Filed Vitals:   10/18/14 0712  BP:   Pulse: 71  Temp:   Resp: 16    Post vital signs: Reviewed  Level of consciousness:alert  Complications: No apparent anesthesia complications

## 2014-10-18 NOTE — Progress Notes (Addendum)
  Subjective: No c/o. Foley bulb out around 1 a.m.  Objective: BP 147/81 mmHg  Pulse 98  Temp(Src) 98.2 F (36.8 C) (Oral)  Resp 16  Ht 5' 2.5" (1.588 m)  Wt 181 lb (82.101 kg)  BMI 32.56 kg/m2  SpO2 100%  LMP 12/02/2013   Total I/O In: 4785.7 [P.O.:240; I.V.:4245.7; IV Piggyback:300] Out: 1010 [Urine:1010]  FHT: BL 120 w/ min variability, occ accels, earlys UC:   irregular SVE: 3/100/-2 per RN Pitocin at 4 mius/min  Assessment:  IUP at 37.2 wks GBS positive Cat 1 Early labor  Plan: Continue w/ current plan Expect progress and SVD  Sherre ScarletWILLIAMS, Sylva Overley CNM 10/18/2014, 3:37 AM

## 2014-10-18 NOTE — Progress Notes (Signed)
Subjective: Postpartum Day 0: Vaginal delivery,no laceration Patient up to bedside, reports no syncope or dizziness. Foley still in place Feeding:  Breast Contraceptive plan:  Micronor  Objective: Vital signs in last 24 hours: Temp:  [97.9 F (36.6 C)-99 F (37.2 C)] 99 F (37.2 C) (12/18 0640) Pulse Rate:  [62-99] 78 (12/18 0731) Resp:  [14-20] 16 (12/18 0800) BP: (120-170)/(73-114) 147/74 mmHg (12/18 0803) SpO2:  [95 %-100 %] 100 % (12/18 0731) Weight:  [178 lb 3.2 oz (80.831 kg)] 178 lb 3.2 oz (80.831 kg) (12/18 0640)  Weight on admission 181  BP range since delivery:  140-160/74-98  Magnesium sulfate on 2 gm/hr Output since delivery 1200 cc clear urine   Physical Exam:  General: alert Lochia: appropriate Uterine Fundus: firm Perineum: healing well DVT Evaluation: No evidence of DVT seen on physical exam. Negative Homan's sign. Calf/Ankle edema is present, 2+ DTR 3+, no clonus   Recent Labs  10/17/14 1300 10/18/14 0553  HGB 12.2 12.7  HCT 35.8* 37.0    Results for orders placed or performed during the hospital encounter of 10/16/14 (from the past 24 hour(s))  CBC     Status: Abnormal   Collection Time: 10/17/14  1:00 PM  Result Value Ref Range   WBC 9.7 4.0 - 10.5 K/uL   RBC 4.14 3.87 - 5.11 MIL/uL   Hemoglobin 12.2 12.0 - 15.0 g/dL   HCT 16.135.8 (L) 09.636.0 - 04.546.0 %   MCV 86.5 78.0 - 100.0 fL   MCH 29.5 26.0 - 34.0 pg   MCHC 34.1 30.0 - 36.0 g/dL   RDW 40.914.9 81.111.5 - 91.415.5 %   Platelets 244 150 - 400 K/uL  Comprehensive metabolic panel     Status: Abnormal   Collection Time: 10/17/14  1:00 PM  Result Value Ref Range   Sodium 134 (L) 137 - 147 mEq/L   Potassium 3.9 3.7 - 5.3 mEq/L   Chloride 101 96 - 112 mEq/L   CO2 21 19 - 32 mEq/L   Glucose, Bld 91 70 - 99 mg/dL   BUN 5 (L) 6 - 23 mg/dL   Creatinine, Ser 7.820.71 0.50 - 1.10 mg/dL   Calcium 9.1 8.4 - 95.610.5 mg/dL   Total Protein 6.2 6.0 - 8.3 g/dL   Albumin 2.3 (L) 3.5 - 5.2 g/dL   AST 12 0 - 37 U/L   ALT  7 0 - 35 U/L   Alkaline Phosphatase 195 (H) 39 - 117 U/L   Total Bilirubin 0.4 0.3 - 1.2 mg/dL   GFR calc non Af Amer >90 >90 mL/min   GFR calc Af Amer >90 >90 mL/min   Anion gap 12 5 - 15  Lactate dehydrogenase     Status: None   Collection Time: 10/17/14  1:00 PM  Result Value Ref Range   LDH 133 94 - 250 U/L  Uric acid     Status: None   Collection Time: 10/17/14  1:00 PM  Result Value Ref Range   Uric Acid, Serum 5.3 2.4 - 7.0 mg/dL  CBC     Status: Abnormal   Collection Time: 10/18/14  5:53 AM  Result Value Ref Range   WBC 17.2 (H) 4.0 - 10.5 K/uL   RBC 4.28 3.87 - 5.11 MIL/uL   Hemoglobin 12.7 12.0 - 15.0 g/dL   HCT 21.337.0 08.636.0 - 57.846.0 %   MCV 86.4 78.0 - 100.0 fL   MCH 29.7 26.0 - 34.0 pg   MCHC 34.3 30.0 - 36.0 g/dL  RDW 15.0 11.5 - 15.5 %   Platelets 253 150 - 400 K/uL  Comprehensive metabolic panel     Status: Abnormal   Collection Time: 10/18/14  5:53 AM  Result Value Ref Range   Sodium 135 (L) 137 - 147 mEq/L   Potassium 4.4 3.7 - 5.3 mEq/L   Chloride 102 96 - 112 mEq/L   CO2 21 19 - 32 mEq/L   Glucose, Bld 103 (H) 70 - 99 mg/dL   BUN 7 6 - 23 mg/dL   Creatinine, Ser 1.320.89 0.50 - 1.10 mg/dL   Calcium 8.8 8.4 - 44.010.5 mg/dL   Total Protein 5.8 (L) 6.0 - 8.3 g/dL   Albumin 2.2 (L) 3.5 - 5.2 g/dL   AST 17 0 - 37 U/L   ALT 7 0 - 35 U/L   Alkaline Phosphatase 200 (H) 39 - 117 U/L   Total Bilirubin 0.3 0.3 - 1.2 mg/dL   GFR calc non Af Amer >90 >90 mL/min   GFR calc Af Amer >90 >90 mL/min   Anion gap 12 5 - 15  Lactate dehydrogenase     Status: None   Collection Time: 10/18/14  5:53 AM  Result Value Ref Range   LDH 192 94 - 250 U/L  Uric acid     Status: None   Collection Time: 10/18/14  5:53 AM  Result Value Ref Range   Uric Acid, Serum 5.4 2.4 - 7.0 mg/dL  MRSA PCR Screening     Status: None   Collection Time: 10/18/14  7:00 AM  Result Value Ref Range   MRSA by PCR NEGATIVE NEGATIVE     Assessment/Plan: Status post vaginal delivery day  0 Pre-eclampsia. Stable Continue current care. D/C foley Continue magnesium sulfate x 24 hours after delivery (0410). Will consult with Dr. Su Hiltoberts regarding need for repeat labs tomorrow.   Nyra CapesLATHAM, VICKICNM 10/18/2014, 9:26 AM

## 2014-10-18 NOTE — Progress Notes (Signed)
UR chart review completed.  

## 2014-10-19 LAB — URIC ACID: Uric Acid, Serum: 5.9 mg/dL (ref 2.4–7.0)

## 2014-10-19 LAB — LACTATE DEHYDROGENASE: LDH: 168 U/L (ref 94–250)

## 2014-10-19 LAB — CBC
HEMATOCRIT: 29.4 % — AB (ref 36.0–46.0)
HEMOGLOBIN: 10.2 g/dL — AB (ref 12.0–15.0)
MCH: 30 pg (ref 26.0–34.0)
MCHC: 34.7 g/dL (ref 30.0–36.0)
MCV: 86.5 fL (ref 78.0–100.0)
Platelets: 207 10*3/uL (ref 150–400)
RBC: 3.4 MIL/uL — ABNORMAL LOW (ref 3.87–5.11)
RDW: 15.1 % (ref 11.5–15.5)
WBC: 14 10*3/uL — ABNORMAL HIGH (ref 4.0–10.5)

## 2014-10-19 LAB — COMPREHENSIVE METABOLIC PANEL
ALT: 7 U/L (ref 0–35)
AST: 16 U/L (ref 0–37)
Albumin: 2 g/dL — ABNORMAL LOW (ref 3.5–5.2)
Alkaline Phosphatase: 148 U/L — ABNORMAL HIGH (ref 39–117)
Anion gap: 10 (ref 5–15)
BUN: 7 mg/dL (ref 6–23)
CALCIUM: 7.6 mg/dL — AB (ref 8.4–10.5)
CO2: 24 meq/L (ref 19–32)
CREATININE: 0.83 mg/dL (ref 0.50–1.10)
Chloride: 103 mEq/L (ref 96–112)
Glucose, Bld: 111 mg/dL — ABNORMAL HIGH (ref 70–99)
Potassium: 3.8 mEq/L (ref 3.7–5.3)
Sodium: 137 mEq/L (ref 137–147)
Total Protein: 4.9 g/dL — ABNORMAL LOW (ref 6.0–8.3)

## 2014-10-19 MED ORDER — SALINE SPRAY 0.65 % NA SOLN
1.0000 | NASAL | Status: DC | PRN
  Administered 2014-10-19: 1 via NASAL
  Filled 2014-10-19: qty 44

## 2014-10-19 NOTE — Lactation Note (Addendum)
This note was copied from the chart of Boy Mack HookBreanna Basto. Lactation Consultation Note     Follow up consult with this mo and baby, now 3737 hours old, and 37 4/7 weeks CGA, small, weighing 5 lbs 0.1 oz. The baby has been breast feeding every 1-2 hours, 2 voids and 7 stools in 37 hours. Mom has easily expressed colostrum. Due to baby's small size, i set up a DEP, showed mom how to pump in premie setting, and showed her how to hand express. I explained that although he is rest feeding well, we want to protect mom's milk supply and provide EBM as supplement for the baby. I told mom, depending on his weight tonight, we may need to begin supplementation with formula, until mom's milk is more plentiful. LPT protocol given to mom, with the explanstion that he is not a LPT baby, but due to his size, much of the protocol applies to him. Mom and dad very receptive to my teaching. Mom knows to call for questions/concerns.  On exam of the baby's mouth, I noted an upper lp frenulum, thick and extends to the gum line. He also appears to have a posterior , short frenulum in the first third of his tongue. At this time, it is not effecting breast feeding  That we can tell, but with his small size, transfer is very important, so if this makes transfer of milk from mom more difficult, supplementation is even more important.   MGF and mom both have a space between their front teeth, and MGM said it ws very painful to latch her daughter to her breast, so she had to stop.   Patient Name: Boy Mack HookBreanna Spieker [id number]'s Date: 10/19/2014 Reason for consult: Follow-up assessment   Maternal Data    Feeding Feeding Type: Breast Fed Length of feed: 10 min  LATCH Score/Interventions Latch: Grasps breast easily, tongue down, lips flanged, rhythmical sucking. Intervention(s): Adjust position;Assist with latch;Breast compression  Audible Swallowing: A few with stimulation  Type of Nipple: Everted at rest and after  stimulation  Comfort (Breast/Nipple): Soft / non-tender     Hold (Positioning): Assistance needed to correctly position infant at breast and maintain latch. Intervention(s): Breastfeeding basics reviewed;Support Pillows;Position options;Skin to skin  LATCH Score: 8  Lactation Tools Discussed/Used WIC Program: Yes [facility]) Pump Review: Setup, frequency, and cleaning;Milk Storage;Other (comment) (hand expression, LPT infant protocol) Initiated by:: clee rn lc Date initiated:: 10/19/14   Consult Status Consult Status: Follow-up Date: 10/20/14 Follow-up type: In-patient    Alfred LevinsLee, Raymona Boss Anne 10/19/2014, 6:03 PM

## 2014-10-19 NOTE — Progress Notes (Addendum)
Anita HookBreanna Strickland  Post Partum Day 1:S/P SVD and MgSO4  Subjective: Patient up ad lib, denies syncope or dizziness. Reports consuming regular diet without issues and denies N/V. Denies issues with urination and reports bleeding is "the same."  Also denies SOB, HA, visual disturbances, numbness/tingling, and epigastric pain. Patient is breastfeeding and reports going well.  Desires Micronor for postpartum contraception.  Pain managed with ibuprofen.  Objective: Filed Vitals:   10/19/14 0406 10/19/14 0500 10/19/14 0600 10/19/14 0700  BP: 121/71 135/80 149/95 133/80  Pulse: 88  80   Temp: 98.3 F (36.8 C)     TempSrc: Oral     Resp: 20 16 20    Height:      Weight:      SpO2: 99% 97% 98%     Recent Labs  10/18/14 0553 10/19/14 0515  HGB 12.7 10.2*  HCT 37.0 29.4*    Physical Exam:  General appearance: alert, cooperative and no distress Lungs: clear to auscultation bilaterally Breasts: Inspection negative Heart: regular rate and rhythm Abdomen: normal findings: bowel sounds normal and soft, non-tender Skin: Skin color, texture, turgor normal. No rashes or lesions Lochia: Small Laceration: None Uterine Fundus: firm, at umbilicus, displaced to right--patient encouraged to urinate DVT Evaluation: No evidence of DVT seen on physical exam. Calf/Ankle edema is present.  Assessment S/P Vaginal Delivery-Day 1 S/P MgSO4 Normal Involution Breastfeeding Hemodynamically Stable  Plan: Discussed r/b of micronor birth control, patient verbalizes understanding and agrees Discussed circumcision scheduling and payment Educated regarding PP Course including bleeding, s/s, and follow up Okay for discharge tomorrow Continue current care Dr.A. Su Hiltoberts to be updated on patient status   Notnamed Croucher LYNN, MSN, CNM 10/19/2014, 11:59 AM

## 2014-10-19 NOTE — Lactation Note (Signed)
This note was copied from the chart of Boy Mack HookBreanna Costen. Lactation Consultation Note  Patient Name: Boy Mack HookBreanna Roen [id number]'s Date: 10/19/2014 Reason for consult: Follow-up assessment   Maternal Data    Feeding Feeding Type: Breast Milk Length of feed: 10 min  LATCH Score/Interventions Latch: Grasps breast easily, tongue down, lips flanged, rhythmical sucking. Intervention(s): Adjust position;Assist with latch;Breast compression  Audible Swallowing: A few with stimulation  Type of Nipple: Everted at rest and after stimulation  Comfort (Breast/Nipple): Soft / non-tender     Hold (Positioning): Assistance needed to correctly position infant at breast and maintain latch. Intervention(s): Breastfeeding basics reviewed;Support Pillows;Position options;Skin to skin  LATCH Score: 8  Lactation Tools Discussed/Used WIC Program: Yes [facility]) Pump Review: Setup, frequency, and cleaning;Milk Storage;Other (comment) (hand expression, LPT infant protocol) Initiated by:: clee rn lc Date initiated:: 10/19/14   Consult Status Consult Status: Follow-up Date: 10/20/14 Follow-up type: In-patient    Alfred LevinsLee, Cliff Damiani Anne 10/19/2014, 6:09 PM

## 2014-10-20 MED ORDER — HYDROCHLOROTHIAZIDE 25 MG PO TABS
25.0000 mg | ORAL_TABLET | Freq: Every day | ORAL | Status: DC
  Administered 2014-10-20: 25 mg via ORAL
  Filled 2014-10-20 (×2): qty 1

## 2014-10-20 MED ORDER — HYDROCHLOROTHIAZIDE 25 MG PO TABS: 25.0000 mg | ORAL_TABLET | Freq: Every day | ORAL | Status: DC

## 2014-10-20 MED ORDER — NORETHINDRONE 0.35 MG PO TABS: 1.0000 | ORAL_TABLET | Freq: Every day | ORAL | Status: DC

## 2014-10-20 MED ORDER — OXYCODONE-ACETAMINOPHEN 5-325 MG PO TABS: 1.0000 | ORAL_TABLET | ORAL | Status: DC | PRN

## 2014-10-20 MED ORDER — IBUPROFEN 600 MG PO TABS: 600.0000 mg | ORAL_TABLET | Freq: Four times a day (QID) | ORAL | Status: DC | PRN

## 2014-10-20 NOTE — Discharge Summary (Signed)
Vaginal Delivery Discharge Summary  Anita HookBreanna Strickland  DOB:    Jan 20, 1991 MRN:    [id number] CSN:    [id number]  Date of admission:                  10/16/14  Date of discharge:                   10/20/14  Procedures this admission:   Induction of labor for pre-eclampsia, SVB,  Magnesium sulfate during labor and 24 hours pp  Date of Delivery: 10/18/14  Newborn Data:  Live born female  Birth Weight: 5 lb 3.4 oz (2365 g) APGAR: 8, 9  Home with mother. Name: Jonah Circumcision Plan: Outpatient.  History of Present Illness:  [name] is a 23 y.o. female, G2P1011, who presents at 7140w2d weeks gestation. [facility] She was admitted induction of labor due to pre-eclampsia. Her pregnancy has been complicated by:  Patient Active Problem List   Diagnosis Date Noted  . NSVD (normal spontaneous vaginal delivery) 10/18/2014  . HTN complicating peripregnancy, antepartum 10/16/2014  . H/O gastric ulcer 10/14/2014  . Poor fetal growth affecting management of mother in third trimester   . Preeclampsia   . Positive GBS test--10/08/14 10/10/2014  . Smoker 10/10/2014     Hospital Course:  Admitted 10/16/14 for induction due to mild pre-eclampsia. Positive GBS. Progressed with cytotech and pitocin. Utilized epidural and Nubain for pain management.  She was on magnesium sulfate during labor for seizure prophylaxis, and received IV Labetalol for prn BP parameters.  Delivery was performed by Sherre ScarletKimberly Williams, CNM, without complication. Patient and baby tolerated the procedure without difficulty, with no laceration requiring repair (vaginal abrasioin). Infant status was stable and remained in room with mother.  Mother had magnesium sulfate for 24 hours post-delivery, and did well during her postpartum course, with breast feeding going well. Mom's physical exam was WNL on day 2, and  she was discharged home in stable condition. Contraception plan was Micronor.  She received adequate benefit from po pain medications.  She was placed on HCTZ x 1 week for pedal edema.  [facility] RN was to see the patient within the next week for BP check.   Feeding:  breast  Contraception:  oral progesterone-only contraceptive  Discharge hemoglobin:  HEMOGLOBIN  Date Value Ref Range Status  10/19/2014 10.2* 12.0 - 15.0 g/dL Final    Comment:    REPEATED TO VERIFY DELTA CHECK NOTED    HCT  Date Value Ref Range Status  10/19/2014 29.4* 36.0 - 46.0 % Final    Discharge Physical Exam:   General: alert Lochia: appropriate Uterine Fundus: firm Incision: Intact DVT Evaluation: No evidence of DVT seen on physical exam. Negative Homan's sign. Calf/Ankle edema is present, 2+  Intrapartum Procedures: spontaneous vaginal delivery, GBS prophylaxis and magnesium sulfate Postpartum Procedures: magnesium sulfate x 24 hours post-delivery Complications-Operative and Postpartum: none  Discharge Diagnoses: IUP at 37 weeks, mild pre-eclampsia, GBS positive  Discharge Information:  Activity:           pelvic rest Diet:                routine Medications: Ibuprofen, Percocet and HCTZ 25 mg BID x 7 days, Micronor Condition:      stable Instructions:     Discharge to: home  Follow-up Information    [facility]. Schedule an appointment as soon as possible for a visit in 6 weeks.   Specialty:  Obstetrics and Gynecology   Why:  Call for any questions or concerns. Will have [facility] nurse see this week for BP check.   Contact information:   [address] [place] [phone]       Nigel BridgemanLATHAM, Tajia Szeliga CNM 10/20/2014 9:09 AM

## 2014-10-20 NOTE — Discharge Instructions (Signed)
Postpartum Care After Vaginal Delivery °After you deliver your newborn (postpartum period), the usual stay in the hospital is 24-72 hours. If there were problems with your labor or delivery, or if you have other medical problems, you might be in the hospital longer.  °While you are in the hospital, you will receive help and instructions on how to care for yourself and your newborn during the postpartum period.  °While you are in the hospital: °· Be sure to tell your nurses if you have pain or discomfort, as well as where you feel the pain and what makes the pain worse. °· If you had an incision made near your vagina (episiotomy) or if you had some tearing during delivery, the nurses may put ice packs on your episiotomy or tear. The ice packs may help to reduce the pain and swelling. °· If you are breastfeeding, you may feel uncomfortable contractions of your uterus for a couple of weeks. This is normal. The contractions help your uterus get back to normal size. °· It is normal to have some bleeding after delivery. °· For the first 1-3 days after delivery, the flow is red and the amount may be similar to a period. °· It is common for the flow to start and stop. °· In the first few days, you may pass some small clots. Let your nurses know if you begin to pass large clots or your flow increases. °· Do not  flush blood clots down the toilet before having the nurse look at them. °· During the next 3-10 days after delivery, your flow should become more watery and pink or brown-tinged in color. °· Ten to fourteen days after delivery, your flow should be a small amount of yellowish-white discharge. °· The amount of your flow will decrease over the first few weeks after delivery. Your flow may stop in 6-8 weeks. Most women have had their flow stop by 12 weeks after delivery. °· You should change your sanitary pads frequently. °· Wash your hands thoroughly with soap and water for at least 20 seconds after changing pads, using  the toilet, or before holding or feeding your newborn. °· You should feel like you need to empty your bladder within the first 6-8 hours after delivery. °· In case you become weak, lightheaded, or faint, call your nurse before you get out of bed for the first time and before you take a shower for the first time. °· Within the first few days after delivery, your breasts may begin to feel tender and full. This is called engorgement. Breast tenderness usually goes away within 48-72 hours after engorgement occurs. You may also notice milk leaking from your breasts. If you are not breastfeeding, do not stimulate your breasts. Breast stimulation can make your breasts produce more milk. °· Spending as much time as possible with your newborn is very important. During this time, you and your newborn can feel close and get to know each other. Having your newborn stay in your room (rooming in) will help to strengthen the bond with your newborn.  It will give you time to get to know your newborn and become comfortable caring for your newborn. °· Your hormones change after delivery. Sometimes the hormone changes can temporarily cause you to feel sad or tearful. These feelings should not last more than a few days. If these feelings last longer than that, you should talk to your caregiver. °· If desired, talk to your caregiver about methods of family planning or contraception. °·   Talk to your caregiver about immunizations. Your caregiver may want you to have the following immunizations before leaving the hospital:  Tetanus, diphtheria, and pertussis (Tdap) or tetanus and diphtheria (Td) immunization. It is very important that you and your family (including grandparents) or others caring for your newborn are up-to-date with the Tdap or Td immunizations. The Tdap or Td immunization can help protect your newborn from getting ill.  Rubella immunization.  Varicella (chickenpox) immunization.  Influenza immunization. You should  receive this annual immunization if you did not receive the immunization during your pregnancy. Document Released: 08/15/2007 Document Revised: 07/12/2012 Document Reviewed: 06/14/2012 [facility] Patient Information 2015 [place], [place]. This information is not intended to replace advice given to you by your health care provider. Make sure you discuss any questions you have with your health care provider.  Hypertension During Pregnancy Hypertension, or high blood pressure, is when there is extra pressure inside your blood vessels that carry blood from the heart to the rest of your body (arteries). It can happen at any time in life, including pregnancy. Hypertension during pregnancy can cause problems for you and your baby. Your baby might not weigh as much as he or she should at birth or might be born early (premature). Very bad cases of hypertension during pregnancy can be life-threatening.  Different types of hypertension can occur during pregnancy. These include: Chronic hypertension. This happens when a woman has hypertension before pregnancy and it continues during pregnancy. Gestational hypertension. This is when hypertension develops during pregnancy. Preeclampsia or toxemia of pregnancy. This is a very serious type of hypertension that develops only during pregnancy. It affects the whole body and can be very dangerous for both mother and baby.  Gestational hypertension and preeclampsia usually go away after your baby is born. Your blood pressure will likely stabilize within 6 weeks. Women who have hypertension during pregnancy have a greater chance of developing hypertension later in life or with future pregnancies. RISK FACTORS There are certain factors that make it more likely for you to develop hypertension during pregnancy. These include: Having hypertension before pregnancy. Having hypertension during a previous pregnancy. Being overweight. Being older than 40 years. Being pregnant with  more than one baby. Having diabetes or kidney problems. SIGNS AND SYMPTOMS Chronic and gestational hypertension rarely cause symptoms. Preeclampsia has symptoms, which may include: Increased protein in your urine. Your health care provider will check for this at every prenatal visit. Swelling of your hands and face. Rapid weight gain. Headaches. Visual changes. Being bothered by light. Abdominal pain, especially in the upper right area. Chest pain. Shortness of breath. Increased reflexes. Seizures. These occur with a more severe form of preeclampsia, called eclampsia. DIAGNOSIS  You may be diagnosed with hypertension during a regular prenatal exam. At each prenatal visit, you may have: Your blood pressure checked. A urine test to check for protein in your urine. The type of hypertension you are diagnosed with depends on when you developed it. It also depends on your specific blood pressure reading. Developing hypertension before 20 weeks of pregnancy is consistent with chronic hypertension. Developing hypertension after 20 weeks of pregnancy is consistent with gestational hypertension. Hypertension with increased urinary protein is diagnosed as preeclampsia. Blood pressure measurements that stay above 160 systolic or 110 diastolic are a sign of severe preeclampsia. TREATMENT Treatment for hypertension during pregnancy varies. Treatment depends on the type of hypertension and how serious it is. If you take medicine for chronic hypertension, you may need to switch medicines.  Medicines called ACE inhibitors should not be taken during pregnancy. Low-dose aspirin may be suggested for women who have risk factors for preeclampsia. If you have gestational hypertension, you may need to take a blood pressure medicine that is safe during pregnancy. Your health care provider will recommend the correct medicine. If you have severe preeclampsia, you may need to be in the hospital. Health care  providers will watch you and your baby very closely. You also may need to take medicine called magnesium sulfate to prevent seizures and lower blood pressure. Sometimes, an early delivery is needed. This may be the case if the condition worsens. It would be done to protect you and your baby. The only cure for preeclampsia is delivery. Your health care provider may recommend that you take one low-dose aspirin (81 mg) each day to help prevent high blood pressure during your pregnancy if you are at risk for preeclampsia. You may be at risk for preeclampsia if: You had preeclampsia or eclampsia during a previous pregnancy. Your baby did not grow as expected during a previous pregnancy. You experienced preterm birth with a previous pregnancy. You experienced a separation of the placenta from the uterus (placental abruption) during a previous pregnancy. You experienced the loss of your baby during a previous pregnancy. You are pregnant with more than one baby. You have other medical conditions, such as diabetes or an autoimmune disease. HOME CARE INSTRUCTIONS Schedule and keep all of your regular prenatal care appointments. This is important. Take medicines only as directed by your health care provider. Tell your health care provider about all medicines you take. Eat as little salt as possible. Get regular exercise. Do not drink alcohol. Do not use tobacco products. Do not drink products with caffeine. Lie on your left side when resting. SEEK IMMEDIATE MEDICAL CARE IF: You have severe abdominal pain. You have sudden swelling in your hands, ankles, or face. You gain 4 pounds (1.8 kg) or more in 1 week. You vomit repeatedly. You have vaginal bleeding. You have a severe headache. You have blurred or double vision. You have muscle twitching or spasms. You have shortness of breath. You have blue fingernails or lips. You have blood in your urine. MAKE SURE YOU: Understand these  instructions. Will watch your condition. Will get help right away if you are not doing well or get worse. Document Released: 07/06/2011 Document Revised: 03/04/2014 Document Reviewed: 05/17/2013 [facility] Patient Information 2015 [place], [place]. This information is not intended to replace advice given to you by your health care provider. Make sure you discuss any questions you have with your health care provider.

## 2014-10-20 NOTE — Plan of Care (Signed)
Problem: Problem: Cardiovascular Progression Goal: NO BP ELEVATION Patient has been started on Hydrochlorothiazide 25 mg. Daily.

## 2014-10-20 NOTE — Lactation Note (Signed)
This note was copied from the chart of Anita Mack HookBreanna Somoza. Lactation Consultation Note  Patient Name: Anita Strickland: 10/20/2014 Reason for consult: Follow-up assessment Baby 56 hours of life. Mom states that her goal for breastfeeding is to nurse and give formula. Discussed supply and demand, and the need to nurse first, supplement, then post-pump. Discussed need to limit total feeding to 30 minutes and need to protect baby from too much stimulation, due to early gestation and low weight. Discussed engorgement prevention/treatment. Mom aware of OP/BGSG and LC phone line assistance after D/C. Referred parents to Baby and Me booklet for number of diapers to expect by day of life and EBM storage guidelines. Enc mom to all for assistance as needed. [facility] and enc to call [facility] first thing Monday, especially with Holiday hours.  Maternal Data    Feeding Feeding Type: Breast Fed Length of feed: 20 min  LATCH Score/Interventions Latch: Grasps breast easily, tongue down, lips flanged, rhythmical sucking. Intervention(s): Adjust position  Audible Swallowing: A few with stimulation  Type of Nipple: Everted at rest and after stimulation  Comfort (Breast/Nipple): Soft / non-tender     Hold (Positioning): Assistance needed to correctly position infant at breast and maintain latch. Intervention(s): Breastfeeding basics reviewed  LATCH Score: 8  Lactation Tools Discussed/Used     Consult Status Consult Status: Complete    Geralynn OchsWILLIARD, Garrette Caine 10/20/2014, 12:27 PM

## 2014-12-27 ENCOUNTER — Encounter: Payer: Self-pay | Admitting: General Practice

## 2015-10-11 ENCOUNTER — Emergency Department (HOSPITAL_COMMUNITY)
Admission: EM | Admit: 2015-10-11 | Discharge: 2015-10-11 | Disposition: A | Discharge status: Home / Self Care | Payer: Medicaid Other | Attending: Emergency Medicine | Admitting: Emergency Medicine

## 2015-10-11 ENCOUNTER — Encounter (HOSPITAL_COMMUNITY): Payer: Self-pay | Admitting: Emergency Medicine

## 2015-10-11 DIAGNOSIS — Z79899 Other long term (current) drug therapy: Secondary | ICD-10-CM | POA: Diagnosis not present

## 2015-10-11 DIAGNOSIS — L02215 Cutaneous abscess of perineum: Secondary | ICD-10-CM | POA: Insufficient documentation

## 2015-10-11 DIAGNOSIS — F17210 Nicotine dependence, cigarettes, uncomplicated: Secondary | ICD-10-CM | POA: Insufficient documentation

## 2015-10-11 DIAGNOSIS — R102 Pelvic and perineal pain: Secondary | ICD-10-CM | POA: Diagnosis present

## 2015-10-11 MED ORDER — CEPHALEXIN 500 MG PO CAPS: 500.0000 mg | ORAL_CAPSULE | Freq: Four times a day (QID) | ORAL | Status: DC

## 2015-10-11 MED ORDER — IBUPROFEN 800 MG PO TABS: 800.0000 mg | ORAL_TABLET | Freq: Three times a day (TID) | ORAL | Status: DC

## 2015-10-11 MED ORDER — CEPHALEXIN 250 MG PO CAPS
500.0000 mg | ORAL_CAPSULE | Freq: Once | ORAL | Status: AC
  Administered 2015-10-11: 500 mg via ORAL
  Filled 2015-10-11: qty 2

## 2015-10-11 MED ORDER — BUPIVACAINE HCL (PF) 0.5 % IJ SOLN
10.0000 mL | Freq: Once | INTRAMUSCULAR | Status: AC
  Administered 2015-10-11: 10 mL
  Filled 2015-10-11: qty 10

## 2015-10-11 MED ORDER — OXYCODONE-ACETAMINOPHEN 5-325 MG PO TABS
2.0000 | ORAL_TABLET | Freq: Once | ORAL | Status: AC
  Administered 2015-10-11: 2 via ORAL
  Filled 2015-10-11: qty 2

## 2015-10-11 MED ORDER — OXYCODONE-ACETAMINOPHEN 5-325 MG PO TABS: 2.0000 | ORAL_TABLET | ORAL | Status: DC | PRN

## 2015-10-11 NOTE — Discharge Instructions (Signed)
You have been seen today for an abscess. Follow up with PCP or return to the ED in 3 days for a wound check. Return to ED should symptoms worsen. It is important that you keep the area clean and dry. Return to the ED should signs of infection arise, including redness, swelling, increased pain, red streaking, or any other major concerns. You may take ibuprofen or Tylenol for pain. Change the bandage about every 12 hours. It is important that you take all the antibiotics in their entirety.    Emergency Department Resource Guide 1) Find a Doctor and Pay Out of Pocket Although you won't have to find out who is covered by your insurance plan, it is a good idea to ask around and get recommendations. You will then need to call the office and see if the doctor you have chosen will accept you as a new patient and what types of options they offer for patients who are self-pay. Some doctors offer discounts or will set up payment plans for their patients who do not have insurance, but you will need to ask so you aren't surprised when you get to your appointment.  2) Contact Your Local Health Department Not all health departments have doctors that can see patients for sick visits, but many do, so it is worth a call to see if yours does. If you don't know where your local health department is, you can check in your phone book. The CDC also has a tool to help you locate your state's health department, and many state websites also have listings of all of their local health departments.  3) Find a Walk-in Clinic If your illness is not likely to be very severe or complicated, you may want to try a walk in clinic. These are popping up all over the country in pharmacies, drugstores, and shopping centers. They're usually staffed by nurse practitioners or physician assistants that have been trained to treat common illnesses and complaints. They're usually fairly quick and inexpensive. However, if you have serious medical  issues or chronic medical problems, these are probably not your best option.  No Primary Care Doctor: - Call Health Connect at  [phone] - they can help you locate a primary care doctor that  accepts your insurance, provides certain services, etc. - Physician Referral Service- [phone]  Chronic Pain Problems: Organization         Address  Phone   Notes  [facility]  [phone] Patients need to be referred by their primary care doctor.   Medication Assistance: Organization         Address  Phone   Notes  [facility] [address] [place], [place] [address] [phone] --Must be a resident of [facility] [place] -- Must have NO insurance coverage whatsoever (no Medicaid/ Medicare, etc.) -- The pt. MUST have a primary care doctor that directs their care regularly and follows them in the community   MedAssist  [phone]   [employer]  [phone]    Agencies that provide inexpensive medical care: Organization         Address  Phone   Notes  [facility]  [phone]   [facility]    [phone]   [facility] [address] [place], [place] [address] [phone]   [facility] [address], [place] [phone]   Planned Parenthood    (  [phone]   [facility]    [phone]   [facility]  [address], [place] Phone:  [phone], Fax:  [phone] Hours of Operation:  9 am - 6 pm, M-F.  Also accepts Medicaid/Medicare and self-pay.  [facility] for [place] [place], [address], [place] Phone: [phone], Fax: [phone]. Hours of Operation:  8:30 am - 5:30 pm, M-F.  Also accepts Medicaid and self-pay.  [facility] [place] [address], [place] Phone: [phone]   [place], [place], [place]  [phone], Ext. [phone] Mondays & Thursdays: 7-9 AM.  First 15 patients are seen on a first come, first serve basis.    [place] Providers:  Organization         Address  Phone   Notes  [facility] [address], [place] [phone] Also accepts self-pay patients.  [facility] [address], [place]  [phone]   [place], [address], [place] [phone]   [facility] [address] [place] [phone]   Lucianne Lei [address], [place]   [phone] Only accepts [place] Access [place] patients after they have their name applied to their card.   Self-Pay (no insurance) in [facility]:  Organization         Address  Phone   Notes  Sickle Cell Patients, [facility] [place] [phone]   [facility] [place] [phone]   [facility] [place]  [place], [place], [place] [phone]   [facility]/Dr. Osei-Bonsu  [address] [place] or [address], [place] [phone] Phone number for both [place] and [place] locations is the same.  [facility] [address] [place] [phone]   [facility] [address] [place] or [address] [phone]   [facility] [address] [place] [phone], phone; [phone], fax Sees patients 1st and 3rd Saturday of every month.  Must not qualify for public or private insurance (i.e. Medicaid, Medicare, [place] Health Choice, Veterans' Benefits)  Household income should be no more than 200% of the poverty level The clinic cannot treat you if you are pregnant or think you are pregnant  Sexually transmitted  diseases are not treated at the clinic.    Dental Care: Organization         Address  Phone  Notes  [facility] [place] [phone] Accepts children up to age 73 who are enrolled in [place] or [place]; pregnant women with a Medicaid card; and children who have applied for Medicaid or [place] Health Choice, but were declined, whose parents can pay a reduced fee at time of service.  [facility] Department of [facility]  [address], [place] [phone] Accepts children up to age 14 who are enrolled in [place] or [place]; pregnant women with a Medicaid card; and children who have applied for Medicaid or [place], but were declined, whose parents can pay a  reduced fee at time of service.  [place] Adult [facility] Access PROGRAM  [place] [phone] Patients are seen by appointment only. Walk-ins are not accepted. [place] will see patients 42 years of age and older. Monday - Tuesday (8am-5pm) Most Wednesdays (8:30-5pm) $30 per visit, cash only  [facility] Adult Dental Access PROGRAM  [address], [facility] [phone] Patients are seen by appointment only. Walk-ins are not accepted. [place] will see patients 71 years of age and older. One Wednesday Evening (Monthly: Volunteer Based).  $30 per visit, cash only  [place]  [phone] for adults; Children under age 66, call Graduate Pediatric Dentistry at [phone]. Children aged 12-14, please call [phone] to request a pediatric application.  Dental services are provided in all areas of dental care including fillings, crowns and bridges, complete and partial dentures, implants, gum treatment, root canals, and extractions. Preventive care is also provided. Treatment is provided to both adults and children. Patients are selected via a  lottery and there is often a waiting list.   [facility] [address], [place]  [phone] [email or web address]   [facility] [address] [place], [place] [phone], Ext. [phone] Second and Fourth Thursday of each month, opens at 6:30 AM; Clinic ends at 9 AM.  Patients are seen on a first-come first-served basis, and a limited number are seen during each clinic.   [facility]  [address] [place], [place] [phone]   Eligibility Requirements You must have lived in [place], [place], or [place] counties for at least the last three months.   You cannot be eligible for state or federal sponsored [employer], including [employer], [place], or [employer].   You generally cannot be eligible for healthcare insurance through your employer.    How to apply: Eligibility screenings are held every Tuesday and Wednesday afternoon from 1:00 pm until 4:00 pm. You do not need an appointment for the interview!  [facility] [address] [place], [place]   [place]  [place] Department  [place]  [phone]    Behavioral Health Resources in the Community: Intensive Outpatient Programs Organization         Address  Phone  Notes  [place] [address], [place], [place] [phone]   [facility] [address] [place], [place]   ADS: Alcohol & Drug Svcs [address] [place], [place]   [place] [address],  [place], [place] or [phone]   Substance Abuse Resources Organization         Address  Phone  Notes  Alcohol and Drug Services  [phone]   [place]  [phone]   The [place]   Chinita Pester  [phone]   Residential &  Outpatient Substance Abuse Program  [phone]   Psychological Services Organization         Address  Phone  Notes  [facility] [place]  [place]  [phone]   [place] [address] [place] or [phone]    Mobile Crisis Teams Organization         Address  Phone  Notes  [facility], Mobile Crisis Care Unit  [phone]  [facility]  [address] [place], [place]   [facility] [address], [place] [phone]    Self-Help/Support Groups Organization         Address  Phone             Notes  [facility] [place] - variety of support groups  [place] Call for more information  Narcotics Anonymous (NA), [facility]aring [facility] [address], [employer] [place]  2 meetings at this location   Special educational needs teacher         Address  Phone  Notes  [facility] Residential Treatment [place],    [place]  [phone]   [facility]  [address] [place] [address], [place], [place]   [place], [place] [phone] Admissions: 8am-3pm M-F  Incentives Substance [place] [address]    [address], [place] [phone]   [facility] [address] [place], [place], [place]   [facility] [address]  [place], [place]   [facility] Programs - Intensive Outpatient [address], [place], [place]   [facility] ([place].,  [place], [place] [phone] or [phone]   [facility] Services (RTS) [address], [place], [place] Accepts Medicaid  [facility] [address],  [place] [phone] Substance Abuse/Addiction Treatment   [facility] Organization          Address  Phone  Notes  [facility]  [phone]   Domenic Schwab, PhD [address] [place], [place]   [phone] or [phone]   [place] [facility] [place], [place] [phone]   [facility] [address] [place], [place] [phone] Insurance/Medicaid/sponsorship through [facility] and Families [address] [place]                                    [place], [place] [phone] [place] [address], [place] [phone]    Dr. Adele Schilder  [phone]   [facility] Dept. 1) [address] [place] 2) [facility] 3)  [place] [address], [place] [phone]  [phone]   [place] [phone] or [phone] (After Hours)

## 2015-10-11 NOTE — ED Notes (Signed)
Pt reports abscess the size of an egg that has been getting larger since Thursday in her groin. No drainage. Denies fevers.

## 2015-10-11 NOTE — ED Provider Notes (Signed)
CSN: [id number]     Arrival date & time 10/11/15  1933 History   First MD Initiated Contact with Patient 10/11/15 1943     Chief Complaint  Patient presents with  . Abscess     (Consider location/radiation/quality/duration/timing/severity/associated sxs/prior Treatment) HPI   Anita Strickland is a 24 y.o. female, with a history of previous abscess, presenting to the ED with an abscess located between her vagina and rectum that she noticed beginning 3 days ago and has grown since then both in size and with the amount of pain. Patient rates the pain at about 9 out of 10, sharp in nature, nonradiating. Patient has not taken anything for the pain. Patient denies fever/chills, nausea/vomiting, abdominal pain, rectal pain, pain with bowel movements, vaginal pain, discharge, or any other pain or complaints.    Past Medical History  Diagnosis Date  . Medical history non-contributory    Past Surgical History  Procedure Laterality Date  . Abcess drainage      leg  . Esophagogastroduodenoscopy endoscopy     Family History  Problem Relation Age of Onset  . Asthma Brother   . Diabetes Maternal Grandfather    Social History  Substance Use Topics  . Smoking status: Current Every Day Smoker -- 0.10 packs/day    Types: Cigarettes  . Smokeless tobacco: Never Used  . Alcohol Use: Yes   OB History    Gravida Para Term Preterm AB TAB SAB Ectopic Multiple Living   0 1 0 1 0 0 1     Review of Systems  Constitutional: Negative for fever and chills.  Gastrointestinal: Negative for nausea, vomiting, abdominal pain, blood in stool, anal bleeding and rectal pain.  Genitourinary: Negative for vaginal bleeding, vaginal discharge and vaginal pain.       Perineal abscess  All other systems reviewed and are negative.     Allergies  Review of patient's allergies indicates no known allergies.  Home Medications   Prior to Admission medications   Medication Sig Start Date End Date Taking?  Authorizing Provider  cephALEXin (KEFLEX) 500 MG capsule Take 1 capsule (500 mg total) by mouth 4 (four) times daily. 10/11/15   Shawn C Joy, PA-C  hydrochlorothiazide (HYDRODIURIL) 25 MG tablet Take 1 tablet (25 mg total) by mouth daily. 10/21/14 10/28/14  Nigel Bridgeman, CNM  ibuprofen (ADVIL,MOTRIN) 600 MG tablet Take 1 tablet (600 mg total) by mouth every 6 (six) hours as needed. 10/20/14   Nigel Bridgeman, CNM  ibuprofen (ADVIL,MOTRIN) 800 MG tablet Take 1 tablet (800 mg total) by mouth 3 (three) times daily. 10/11/15   Shawn C Joy, PA-C  norethindrone (ORTHO MICRONOR) 0.35 MG tablet Take 1 tablet (0.35 mg total) by mouth daily. 11/10/14   Nigel Bridgeman, CNM  omeprazole (PRILOSEC OTC) 20 MG tablet Take 20 mg by mouth daily as needed (for heartburn).     Historical Provider, MD  oxyCODONE-acetaminophen (PERCOCET/ROXICET) 5-325 MG per tablet Take 1 tablet by mouth every 4 (four) hours as needed (for pain scale less than 7). 10/20/14   Nigel Bridgeman, CNM  oxyCODONE-acetaminophen (PERCOCET/ROXICET) 5-325 MG tablet Take 2 tablets by mouth every 4 (four) hours as needed for severe pain. 10/11/15   Anselm Pancoast, PA-C  Prenatal Vit-Fe Fumarate-FA (PRENATAL MULTIVITAMIN) TABS tablet Take 1 tablet by mouth daily.    Historical Provider, MD   BP 140/94 mmHg  Pulse 102  Temp(Src) 98.2 F (36.8 C) (Oral)  Resp 18  Ht  (1.549 m)  Wt 82.555 kg  BMI 34.41 kg/m2  SpO2 99%  LMP 10/11/2015 Physical Exam  Constitutional: She appears well-developed and well-nourished. No distress.  HENT:  Head: Normocephalic and atraumatic.  Eyes: Conjunctivae are normal. Pupils are equal, round, and reactive to light.  Cardiovascular: Normal rate and regular rhythm.   Pulmonary/Chest: Effort normal. No respiratory distress.  Abdominal: Soft. Bowel sounds are normal. There is no tenderness.  Genitourinary:    There is no tenderness or lesion on the right labia. There is no tenderness or lesion on the left labia.  RN  Claris Gower served as chaperone during exam.  Musculoskeletal: She exhibits no edema or tenderness.  Lymphadenopathy:       Right: No inguinal adenopathy present.       Left: No inguinal adenopathy present.  Neurological: She is alert.  Skin: Skin is warm and dry. She is not diaphoretic.  Nursing note and vitals reviewed.   ED Course  .Marland KitchenIncision and Drainage Date/Time: 10/11/2015 8:28 PM Performed by: Anselm Pancoast Authorized by: Harolyn Rutherford C Consent: Verbal consent obtained. Risks and benefits: risks, benefits and alternatives were discussed Consent given by: patient Patient understanding: patient states understanding of the procedure being performed Patient consent: the patient's understanding of the procedure matches consent given Procedure consent: procedure consent matches procedure scheduled Patient identity confirmed: verbally with patient and arm band Time out: Immediately prior to procedure a "time out" was called to verify the correct patient, procedure, equipment, support staff and site/side marked as required. Type: abscess Body area: anogenital Location details: perineum Anesthesia: local infiltration Local anesthetic: bupivacaine 0.5% without epinephrine Patient sedated: no Scalpel size: 11 Incision type: single straight Incision depth: subcutaneous Complexity: simple Drainage: bloody and  purulent Drainage amount: moderate Wound treatment: wound left open Packing material: 1/4 in iodoform gauze and  wick placed Patient tolerance: Patient tolerated the procedure well with no immediate complications   (including critical care time) Labs Review Labs Reviewed - No data to display  EMERGENCY DEPARTMENT US SOFT TISSUE INTERPRETATION "Study: Limited Ultrasound of the noted body part in comments below"  INDICATIONS: Other (refer to comments) Multiple views of the body part are obtained with a multi-frequency linear probe  PERFORMED BY:  Myself  IMAGES ARCHIVED?:  Yes  SIDE:Midline  BODY PART:Other soft tisse (comment in note)  FINDINGS: Abcess present  LIMITATIONS:  None  INTERPRETATION:  Abcess present  COMMENT:   The indication for this procedure was to assess the depth and composition of the suspected abscess in the perineal region.  No results found. I have personally reviewed and evaluated these images and lab results as part of my medical decision-making.   EKG Interpretation None      MDM   Final diagnoses:  Perineal abscess, superficial    Amere Iott presents with a perineal abscess.  Findings and plan of care discussed with Raeford Razor, MD.  When viewed on the ultrasound and the abscess appears to be superficial and does not seem to extend into the deep tissues, specifically it does not seem to extend into the rectum or the vagina. Patient assures me that she will not be driving home from the ED. It does not appear that further imaging is necessary at this time. Ultrasound shows a superficial abscess that appears safe for I&D. Patient has no signs or symptoms of abdominal, rectal, vaginal, or systemic infection. Procedure went well with no immediate complications. Patient will need to return either to the ED or see a PCP in  3 days time for wound check. This plan of care was communicated with the patient, who agreed to the plan, voiced understanding of the instructions, and is comfortable with discharge.  Anselm PancoastShawn C Joy, PA-C 10/11/15 2112  Raeford RazorStephen Kohut, MD 10/11/15 2249

## 2015-12-07 ENCOUNTER — Inpatient Hospital Stay (HOSPITAL_COMMUNITY)
Admission: AD | Admit: 2015-12-07 | Discharge: 2015-12-08 | Disposition: A | Discharge status: Home / Self Care | Payer: Medicaid Other | Source: Ambulatory Visit | Attending: Obstetrics and Gynecology | Admitting: Obstetrics and Gynecology

## 2015-12-07 ENCOUNTER — Inpatient Hospital Stay (HOSPITAL_COMMUNITY): Payer: Medicaid Other

## 2015-12-07 ENCOUNTER — Encounter (HOSPITAL_COMMUNITY): Payer: Self-pay | Admitting: *Deleted

## 2015-12-07 DIAGNOSIS — R109 Unspecified abdominal pain: Secondary | ICD-10-CM | POA: Diagnosis present

## 2015-12-07 DIAGNOSIS — R1030 Lower abdominal pain, unspecified: Secondary | ICD-10-CM

## 2015-12-07 DIAGNOSIS — B9689 Other specified bacterial agents as the cause of diseases classified elsewhere: Secondary | ICD-10-CM

## 2015-12-07 DIAGNOSIS — N760 Acute vaginitis: Secondary | ICD-10-CM

## 2015-12-07 DIAGNOSIS — R102 Pelvic and perineal pain: Secondary | ICD-10-CM

## 2015-12-07 DIAGNOSIS — Z3490 Encounter for supervision of normal pregnancy, unspecified, unspecified trimester: Secondary | ICD-10-CM

## 2015-12-07 DIAGNOSIS — O26891 Other specified pregnancy related conditions, first trimester: Secondary | ICD-10-CM

## 2015-12-07 LAB — CBC
HEMATOCRIT: 35.5 % — AB (ref 36.0–46.0)
Hemoglobin: 11.9 g/dL — ABNORMAL LOW (ref 12.0–15.0)
MCH: 28.1 pg (ref 26.0–34.0)
MCHC: 33.5 g/dL (ref 30.0–36.0)
MCV: 83.7 fL (ref 78.0–100.0)
Platelets: 353 10*3/uL (ref 150–400)
RBC: 4.24 MIL/uL (ref 3.87–5.11)
RDW: 15.1 % (ref 11.5–15.5)
WBC: 10.7 10*3/uL — AB (ref 4.0–10.5)

## 2015-12-07 LAB — WET PREP, GENITAL
SPERM: NONE SEEN
TRICH WET PREP: NONE SEEN
Yeast Wet Prep HPF POC: NONE SEEN

## 2015-12-07 LAB — URINALYSIS, ROUTINE W REFLEX MICROSCOPIC
BILIRUBIN URINE: NEGATIVE
Glucose, UA: NEGATIVE mg/dL
HGB URINE DIPSTICK: NEGATIVE
Ketones, ur: 15 mg/dL — AB
Leukocytes, UA: NEGATIVE
Nitrite: NEGATIVE
PH: 7 (ref 5.0–8.0)
Protein, ur: NEGATIVE mg/dL
SPECIFIC GRAVITY, URINE: 1.02 (ref 1.005–1.030)

## 2015-12-07 LAB — POCT PREGNANCY, URINE: Preg Test, Ur: POSITIVE — AB

## 2015-12-07 LAB — HCG, QUANTITATIVE, PREGNANCY: hCG, Beta Chain, Quant, S: 418 m[IU]/mL — ABNORMAL HIGH (ref <5)

## 2015-12-07 NOTE — MAU Provider Note (Signed)
History    Anita Strickland is a 25y.o. G2p1011 at unknown gestation who presents, unannounced, for lower abdominal pain.  Patient states she is unaware of onset, but reports pain radiates down to pelvic area into buttocks.  Patient denies pharmacologic management and states nothing aggravates pain while rest relieves pain.  Patient reports "milky white stuff" in regards to vaginal discharge.  Patient reports constipation for about a week with stools that are hard to pass, stating " I really have to strain to get it out."  Patient denies diarrhea or issues with urination.  Patient denies recent sexual intercourse.  Patient states she had a positive UPT on Thursday and denies vaginal bleeding, but states " I am coughing up a little bit of blood with mucous."  Patient currently still smokes about 4 cigarettes a day.  Patient states she was taking "off brand Dayquil," for body aches, congestion, and "thoughts of flu that turned into a sinus thing."  Patient unsure of LMP.   Patient Active Problem List   Diagnosis Date Noted  . NSVD (normal spontaneous vaginal delivery) 10/18/2014  . HTN complicating peripregnancy, antepartum 10/16/2014  . H/O gastric ulcer 10/14/2014  . Poor fetal growth affecting management of mother in third trimester   . Preeclampsia   . Positive GBS test--10/08/14 10/10/2014  . Smoker 10/10/2014    Chief Complaint  Patient presents with  . Pelvic Pain   HPI  OB History    Gravida Para Term Preterm AB TAB SAB Ectopic Multiple Living   0 1 0 1 0 0 1      Past Medical History  Diagnosis Date  . Medical history non-contributory     Past Surgical History  Procedure Laterality Date  . Abcess drainage      leg  . Esophagogastroduodenoscopy endoscopy      Family History  Problem Relation Age of Onset  . Asthma Brother   . Diabetes Maternal Grandfather     Social History  Substance Use Topics  . Smoking status: Current Every Day Smoker -- 0.10 packs/day   Types: Cigarettes  . Smokeless tobacco: Never Used  . Alcohol Use: Yes    Allergies: No Known Allergies  Prescriptions prior to admission  Medication Sig Dispense Refill Last Dose  . cephALEXin (KEFLEX) 500 MG capsule Take 1 capsule (500 mg total) by mouth 4 (four) times daily. 20 capsule 0   . hydrochlorothiazide (HYDRODIURIL) 25 MG tablet Take 1 tablet (25 mg total) by mouth daily. 6 tablet 0   . ibuprofen (ADVIL,MOTRIN) 600 MG tablet Take 1 tablet (600 mg total) by mouth every 6 (six) hours as needed. 30 tablet 2   . ibuprofen (ADVIL,MOTRIN) 800 MG tablet Take 1 tablet (800 mg total) by mouth 3 (three) times daily. 21 tablet 0   . norethindrone (ORTHO MICRONOR) 0.35 MG tablet Take 1 tablet (0.35 mg total) by mouth daily. 1 Package 11   . omeprazole (PRILOSEC OTC) 20 MG tablet Take 20 mg by mouth daily as needed (for heartburn).    Past Month at Unknown time  . oxyCODONE-acetaminophen (PERCOCET/ROXICET) 5-325 MG per tablet Take 1 tablet by mouth every 4 (four) hours as needed (for pain scale less than 7). 30 tablet 0   . oxyCODONE-acetaminophen (PERCOCET/ROXICET) 5-325 MG tablet Take 2 tablets by mouth every 4 (four) hours as needed for severe pain. 8 tablet 0   . Prenatal Vit-Fe Fumarate-FA (PRENATAL MULTIVITAMIN) TABS tablet Take 1 tablet by mouth daily.  Past Week at Unknown time    ROS  See HPI Above Physical Exam   Blood pressure 137/83, pulse 92, temperature 98.1 F (36.7 C), temperature source Oral, resp. rate 18, height 5\' 2"  (1.575 m), weight 83.008 kg (183 lb), last menstrual period 11/02/2015, not currently breastfeeding.  Results for orders placed or performed during the hospital encounter of 12/07/15 (from the past 24 hour(s))  Urinalysis, Routine w reflex microscopic (not at [facility])     Status: Abnormal   Collection Time: 12/07/15 10:35 PM  Result Value Ref Range   Color, Urine YELLOW YELLOW   APPearance CLEAR CLEAR   Specific Gravity, Urine 1.020 1.005 - 1.030   pH  7.0 5.0 - 8.0   Glucose, UA NEGATIVE NEGATIVE mg/dL   Hgb urine dipstick NEGATIVE NEGATIVE   Bilirubin Urine NEGATIVE NEGATIVE   Ketones, ur 15 (A) NEGATIVE mg/dL   Protein, ur NEGATIVE NEGATIVE mg/dL   Nitrite NEGATIVE NEGATIVE   Leukocytes, UA NEGATIVE NEGATIVE  Pregnancy, urine POC     Status: Abnormal   Collection Time: 12/07/15 10:43 PM  Result Value Ref Range   Preg Test, Ur POSITIVE (A) NEGATIVE  CBC     Status: Abnormal   Collection Time: 12/07/15 11:06 PM  Result Value Ref Range   WBC 10.7 (H) 4.0 - 10.5 K/uL   RBC 4.24 3.87 - 5.11 MIL/uL   Hemoglobin 11.9 (L) 12.0 - 15.0 g/dL   HCT 53.6 (L) 64.4 - 03.4 %   MCV 83.7 78.0 - 100.0 fL   MCH 28.1 26.0 - 34.0 pg   MCHC 33.5 30.0 - 36.0 g/dL   RDW 74.2 59.5 - 63.8 %   Platelets 353 150 - 400 K/uL    Physical Exam  Constitutional: She is oriented to person, place, and time. She appears well-developed and well-nourished. No distress.  HENT:  Head: Normocephalic and atraumatic.  Eyes: Conjunctivae are normal.  Neck: Normal range of motion.  Cardiovascular: Normal rate, regular rhythm and normal heart sounds.   Respiratory: Effort normal and breath sounds normal.  GI: Soft. Bowel sounds are normal. She exhibits no distension. There is no tenderness.  Genitourinary: Uterus normal. Uterus is not enlarged. Cervix exhibits no motion tenderness, no discharge and no friability. No tenderness or bleeding in the vagina. Vaginal discharge found.  Sterile Speculum Exam: -Vaginal Vault: Small amt thin white discharge -wet prep collected -Cervix: Appears closed, no discharge from os-GC/CT collected -Bimanual Exam: Closed  Musculoskeletal: Normal range of motion. She exhibits no edema.  Neurological: She is alert and oriented to person, place, and time.  Skin: Skin is warm and dry.    OBSTETRIC <14 WK [place] AND TRANSVAGINAL OB US  TECHNIQUE: Both transabdominal and transvaginal ultrasound examinations were performed for complete  evaluation of the gestation as well as the maternal uterus, adnexal regions, and pelvic cul-de-sac. Transvaginal technique was performed to assess early pregnancy.  COMPARISON: None.  FINDINGS: Intrauterine gestational sac: Not present  Yolk sac: Not present  Embryo: Not present  Cardiac Activity: Not present  Subchorionic hemorrhage: None visualized.  Maternal uterus/adnexae: Normal appearance of the uterus. Small amount of free fluid in the endometrium. PICC Normal appearance the adnexae. Trace free fluid.  IMPRESSION: Small amount of free fluid in the endometrium. Pregnancy may not be sonographically evident below beta HCG 1,000 ; pregnancy of unknown location. Recommend follow-up beta HCG and, pelvic ultrasound in 10-14 days, as clinically indicated.   ED Course  Assessment: 24y.o. Female +UPT Lower Abdominal Pain  Plan: -PE as above -Labs; CBC, HcG, Wet Prep, Gc/CT -Ultrasound   Follow Up (0040) -HcG 418 -Unable to visualize embryo or gestational sac -Patient tearful and educated on expectations regarding pregnancy levels and ultrasound visualization; Reassurances given  -Instructed to return on Wednesday for repeat Hcg -Further instructed to start [facility] at [facility] -Wet prep with +clue cells -Rx for flagyl  BID x 7 days sent to pharmacy, start immediately -Encouraged to call if any questions or concerns arise prior to next scheduled office visit.  -Discharged to home in stable condition  Cherre Robins CNM, MSN 12/07/2015 11:17 PM

## 2015-12-08 LAB — RPR: RPR Ser Ql: NONREACTIVE

## 2015-12-08 LAB — HIV ANTIBODY (ROUTINE TESTING W REFLEX): HIV Screen 4th Generation wRfx: NONREACTIVE

## 2015-12-08 MED ORDER — METRONIDAZOLE 500 MG PO TABS: 500.0000 mg | ORAL_TABLET | Freq: Two times a day (BID) | ORAL | Status: DC

## 2015-12-08 NOTE — Discharge Instructions (Signed)

## 2015-12-09 LAB — GC/CHLAMYDIA PROBE AMP (~~LOC~~) NOT AT ARMC
CHLAMYDIA, DNA PROBE: NEGATIVE
Neisseria Gonorrhea: NEGATIVE

## 2015-12-10 ENCOUNTER — Encounter (HOSPITAL_COMMUNITY): Payer: Self-pay | Admitting: *Deleted

## 2015-12-10 ENCOUNTER — Inpatient Hospital Stay (HOSPITAL_COMMUNITY)
Admission: AD | Admit: 2015-12-10 | Discharge: 2015-12-10 | Disposition: A | Discharge status: Home / Self Care | Payer: Medicaid Other | Source: Ambulatory Visit | Attending: Obstetrics and Gynecology | Admitting: Obstetrics and Gynecology

## 2015-12-10 DIAGNOSIS — Z3201 Encounter for pregnancy test, result positive: Secondary | ICD-10-CM | POA: Diagnosis not present

## 2015-12-10 DIAGNOSIS — Z3200 Encounter for pregnancy test, result unknown: Secondary | ICD-10-CM | POA: Diagnosis present

## 2015-12-10 LAB — HCG, QUANTITATIVE, PREGNANCY: hCG, Beta Chain, Quant, S: 1305 m[IU]/mL — ABNORMAL HIGH (ref <5)

## 2015-12-10 NOTE — MAU Note (Signed)
feeling ok, a little cramping.

## 2015-12-22 ENCOUNTER — Inpatient Hospital Stay (HOSPITAL_COMMUNITY): Payer: Medicaid Other

## 2015-12-22 ENCOUNTER — Inpatient Hospital Stay (HOSPITAL_COMMUNITY)
Admission: AD | Admit: 2015-12-22 | Discharge: 2015-12-22 | Disposition: A | Discharge status: Home / Self Care | Payer: Medicaid Other | Source: Ambulatory Visit | Attending: Obstetrics & Gynecology | Admitting: Obstetrics & Gynecology

## 2015-12-22 ENCOUNTER — Encounter (HOSPITAL_COMMUNITY): Payer: Self-pay | Admitting: *Deleted

## 2015-12-22 DIAGNOSIS — R102 Pelvic and perineal pain: Secondary | ICD-10-CM | POA: Diagnosis not present

## 2015-12-22 DIAGNOSIS — Z8719 Personal history of other diseases of the digestive system: Secondary | ICD-10-CM | POA: Insufficient documentation

## 2015-12-22 DIAGNOSIS — Z3A01 Less than 8 weeks gestation of pregnancy: Secondary | ICD-10-CM | POA: Insufficient documentation

## 2015-12-22 DIAGNOSIS — O131 Gestational [pregnancy-induced] hypertension without significant proteinuria, first trimester: Secondary | ICD-10-CM | POA: Insufficient documentation

## 2015-12-22 DIAGNOSIS — N939 Abnormal uterine and vaginal bleeding, unspecified: Secondary | ICD-10-CM

## 2015-12-22 DIAGNOSIS — F17210 Nicotine dependence, cigarettes, uncomplicated: Secondary | ICD-10-CM | POA: Diagnosis not present

## 2015-12-22 DIAGNOSIS — O209 Hemorrhage in early pregnancy, unspecified: Secondary | ICD-10-CM | POA: Insufficient documentation

## 2015-12-22 DIAGNOSIS — Z8711 Personal history of peptic ulcer disease: Secondary | ICD-10-CM | POA: Diagnosis not present

## 2015-12-22 HISTORY — DX: Gestational (pregnancy-induced) hypertension without significant proteinuria, unspecified trimester: O13.9

## 2015-12-22 LAB — WET PREP, GENITAL
CLUE CELLS WET PREP: NONE SEEN
Sperm: NONE SEEN
TRICH WET PREP: NONE SEEN
YEAST WET PREP: NONE SEEN

## 2015-12-22 LAB — HCG, QUANTITATIVE, PREGNANCY: HCG, BETA CHAIN, QUANT, S: 58073 m[IU]/mL — AB (ref <5)

## 2015-12-22 NOTE — MAU Note (Signed)
Urine sent to lab .

## 2015-12-22 NOTE — MAU Note (Signed)
Started bleeding about an hour ago.  First episode of bleeding.  Little cramping.

## 2015-12-22 NOTE — MAU Provider Note (Signed)
History    Anita Strickland, 25 yo, G3P1011, 7 wks.1 days present to the MAU announced for bleeding that started at 2pm this evening with pelvic cramping.  Reports sexual intercourse last night.     Patient Active Problem List   Diagnosis Date Noted  . NSVD (normal spontaneous vaginal delivery) 10/18/2014  . HTN complicating peripregnancy, antepartum 10/16/2014  . H/O gastric ulcer 10/14/2014  . Poor fetal growth affecting management of mother in third trimester   . Preeclampsia   . Positive GBS test--10/08/14 10/10/2014  . Smoker 10/10/2014    Chief Complaint  Patient presents with  . Vaginal Bleeding   Vaginal Bleeding    OB History    Gravida Para Term Preterm AB TAB SAB Ectopic Multiple Living   0 1 0 1 0 0 1      Past Medical History  Diagnosis Date  . Pregnancy induced hypertension     Past Surgical History  Procedure Laterality Date  . Abcess drainage      leg  . Esophagogastroduodenoscopy endoscopy      Family History  Problem Relation Age of Onset  . Asthma Brother   . Diabetes Maternal Grandfather     Social History  Substance Use Topics  . Smoking status: Current Every Day Smoker -- 0.10 packs/day    Types: Cigarettes  . Smokeless tobacco: Never Used  . Alcohol Use: Yes    Allergies: No Known Allergies  Prescriptions prior to admission  Medication Sig Dispense Refill Last Dose  . albuterol (PROVENTIL HFA;VENTOLIN HFA) 108 (90 Base) MCG/ACT inhaler Inhale 1-2 puffs into the lungs every 6 (six) hours as needed for wheezing or shortness of breath.   12/21/2015 at Unknown time  . Meclizine HCl (BONINE PO) Take 2 tablets by mouth once.   Past Week at Unknown time  . metroNIDAZOLE (FLAGYL) 500 MG tablet Take 1 tablet (500 mg total) by mouth 2 (two) times daily. 14 tablet 0 12/21/2015 at Unknown time  . Prenatal Vit-Fe Fumarate-FA (PRENATAL MULTIVITAMIN) TABS tablet Take 1 tablet by mouth daily at 12 noon.   12/21/2015 at Unknown time     Review of Systems  Genitourinary: Positive for vaginal bleeding.   Physical Exam   Blood pressure 132/59, pulse 73, temperature 98.5 F (36.9 C), temperature source Oral, resp. rate 17, weight 81.829 kg (180 lb 6.4 oz), last menstrual period 11/02/2015, not currently breastfeeding.    Physical Exam  Constitutional: She is oriented to person, place, and time. She appears well-developed and well-nourished.  HENT:  Head: Normocephalic.  Eyes: Pupils are equal, round, and reactive to light.  Neck: Normal range of motion.  Cardiovascular: Normal rate.   Respiratory: Effort normal.  GI: Soft.  Genitourinary: Cervix exhibits no discharge and no friability. There is bleeding in the vagina. No tenderness in the vagina.  Musculoskeletal: Normal range of motion.  Neurological: She is alert and oriented to person, place, and time. She has normal reflexes.  Skin: Skin is warm and dry.  Psychiatric: She has a normal mood and affect. Her behavior is normal. Judgment and thought content normal.   Results for orders placed or performed during the hospital encounter of 12/22/15 (from the past 24 hour(s))  hCG, quantitative, pregnancy     Status: Abnormal   Collection Time: 12/22/15  4:49 PM  Result Value Ref Range   hCG, Beta Chain, Quant, S 58073 (H) <5 mIU/mL  Wet prep, genital     Status: Abnormal  Collection Time: 12/22/15  5:30 PM  Result Value Ref Range   Yeast Wet Prep HPF POC NONE SEEN NONE SEEN   Trich, Wet Prep NONE SEEN NONE SEEN   Clue Cells Wet Prep HPF POC NONE SEEN NONE SEEN   WBC, Wet Prep HPF POC FEW (A) NONE SEEN   Sperm NONE SEEN      [place]: Single living intrauterine embryo estimated at 6 weeks and 0 days gestation. 2. Moderate subchorionic hemorrhage. 3. Normal ovaries. 4. Trace free pelvic fluid.    ED Course  Assessment: IUP 6.0 wks  Subchorionic hemorrhage Cramping  Plan: DC home  Pelvic rest and bleeding precautions Follow up with [facility] for  scheduled Ob appointment    Alphonzo Severance CNM, MSN 12/22/2015 7:20 PM    MAU Addendum Note Report received, care assumed.  Q&A about [place] report Pt DC to home in stable condition   Greenley Martone, CNM, MSN 12/22/2015. 7:30 PM

## 2015-12-23 LAB — GC/CHLAMYDIA PROBE AMP (~~LOC~~) NOT AT ARMC
Chlamydia: NEGATIVE
Neisseria Gonorrhea: NEGATIVE

## 2016-01-16 ENCOUNTER — Ambulatory Visit (INDEPENDENT_AMBULATORY_CARE_PROVIDER_SITE_OTHER): Payer: Medicaid Other | Admitting: Certified Nurse Midwife

## 2016-01-16 ENCOUNTER — Encounter: Payer: Self-pay | Admitting: Certified Nurse Midwife

## 2016-01-16 VITALS — BP 119/78 | HR 77 | Temp 98.8°F | Wt 180.0 lb

## 2016-01-16 DIAGNOSIS — B9689 Other specified bacterial agents as the cause of diseases classified elsewhere: Secondary | ICD-10-CM

## 2016-01-16 DIAGNOSIS — N760 Acute vaginitis: Secondary | ICD-10-CM | POA: Diagnosis not present

## 2016-01-16 DIAGNOSIS — O09291 Supervision of pregnancy with other poor reproductive or obstetric history, first trimester: Secondary | ICD-10-CM

## 2016-01-16 DIAGNOSIS — O219 Vomiting of pregnancy, unspecified: Secondary | ICD-10-CM

## 2016-01-16 DIAGNOSIS — O4691 Antepartum hemorrhage, unspecified, first trimester: Secondary | ICD-10-CM

## 2016-01-16 DIAGNOSIS — A499 Bacterial infection, unspecified: Secondary | ICD-10-CM | POA: Diagnosis not present

## 2016-01-16 DIAGNOSIS — Z3480 Encounter for supervision of other normal pregnancy, unspecified trimester: Secondary | ICD-10-CM | POA: Insufficient documentation

## 2016-01-16 DIAGNOSIS — Z3481 Encounter for supervision of other normal pregnancy, first trimester: Secondary | ICD-10-CM | POA: Diagnosis not present

## 2016-01-16 DIAGNOSIS — O209 Hemorrhage in early pregnancy, unspecified: Secondary | ICD-10-CM

## 2016-01-16 LAB — POCT URINALYSIS DIPSTICK
BILIRUBIN UA: NEGATIVE
GLUCOSE UA: NEGATIVE
KETONES UA: NEGATIVE
Leukocytes, UA: NEGATIVE
NITRITE UA: NEGATIVE
RBC UA: NEGATIVE
Spec Grav, UA: 1.02
Urobilinogen, UA: NEGATIVE
pH, UA: 6

## 2016-01-16 MED ORDER — TERCONAZOLE 0.4 % VA CREA: 1.0000 | TOPICAL_CREAM | Freq: Every day | VAGINAL | Status: DC

## 2016-01-16 MED ORDER — VITAFOL-NANO 18-0.6-0.4 MG PO TABS: 1.0000 | ORAL_TABLET | Freq: Every day | ORAL | Status: DC

## 2016-01-16 MED ORDER — CLINDAMYCIN PHOSPHATE 100 MG VA SUPP: 100.0000 mg | Freq: Every day | VAGINAL | Status: DC

## 2016-01-16 MED ORDER — DOXYLAMINE-PYRIDOXINE 10-10 MG PO TBEC: DELAYED_RELEASE_TABLET | ORAL | Status: DC

## 2016-01-16 NOTE — Progress Notes (Signed)
Subjective:    Anita Strickland is being seen today for her first obstetrical visit.  This is not a planned pregnancy. She is at 2859w4d gestation. Her obstetrical history is significant for pre-eclampsia and delivered at 37 weeks, was induced. Relationship with FOB: significant other, living together. Patient does intend to breast feed. Pregnancy history fully reviewed.  The information documented in the HPI was reviewed and verified.  Menstrual History: OB History    Gravida Para Term Preterm AB TAB SAB Ectopic Multiple Living   3 1 1  0 1 1 0 0 0 1      Menarche age: 25 years of age.   Patient's last menstrual period was 11/02/2015 (approximate).    Past Medical History  Diagnosis Date  . Pregnancy induced hypertension     Past Surgical History  Procedure Laterality Date  . Abcess drainage      leg  . Esophagogastroduodenoscopy endoscopy       (Not in a hospital admission) No Known Allergies  Social History  Substance Use Topics  . Smoking status: Current Every Day Smoker -- 0.10 packs/day    Types: Cigarettes  . Smokeless tobacco: Never Used  . Alcohol Use: No    Family History  Problem Relation Age of Onset  . Asthma Brother   . Diabetes Maternal Grandfather   . Hypertension Father      Review of Systems Constitutional: negative for weight loss Gastrointestinal: negative for vomiting, + nausea Genitourinary:negative for genital lesions and vaginal discharge and dysuria, + vaginal bleeding Musculoskeletal:negative for back pain Behavioral/Psych: negative for abusive relationship, depression, illegal drug usage and + tobacco use    Objective:    BP 119/78 mmHg  Pulse 77  Temp(Src) 98.8 F (37.1 C)  Wt 180 lb (81.647 kg)  LMP 11/02/2015 (Approximate) General Appearance:    Alert, cooperative, no distress, appears stated age  Head:    Normocephalic, without obvious abnormality, atraumatic  Eyes:    PERRL, conjunctiva/corneas clear, EOM's intact, fundi   benign, both eyes  Ears:    Normal TM's and external ear canals, both ears  Nose:   Nares normal, septum midline, mucosa normal, no drainage    or sinus tenderness  Throat:   Lips, mucosa, and tongue normal; teeth and gums normal  Neck:   Supple, symmetrical, trachea midline, no adenopathy;    thyroid:  no enlargement/tenderness/nodules; no carotid   bruit or JVD  Back:     Symmetric, no curvature, ROM normal, no CVA tenderness  Lungs:     Clear to auscultation bilaterally, respirations unlabored  Chest Wall:    No tenderness or deformity   Heart:    Regular rate and rhythm, S1 and S2 normal, no murmur, rub   or gallop  Breast Exam:    No tenderness, masses, or nipple abnormality  Abdomen:     Soft, non-tender, bowel sounds active all four quadrants,    no masses, no organomegaly  Genitalia:    Normal female without lesion, discharge or tenderness  Extremities:   Extremities normal, atraumatic, no cyanosis or edema  Pulses:   2+ and symmetric all extremities  Skin:   Skin color, texture, turgor normal, no rashes or lesions  Lymph nodes:   Cervical, supraclavicular, and axillary nodes normal  Neurologic:   CNII-XII intact, normal strength, sensation and reflexes    throughout      cervix:  Long, thick, closed and posterior.  FHR seen with bedside US.      Lab  Review Urine pregnancy test Labs reviewed yes Radiologic studies reviewed yes  Assessment:    Pregnancy at [redacted]w[redacted]d weeks   N&V in early pregnancy  Vaginal bleeding in early pregnancy  Tobacco abuse  Plan:      Prenatal vitamins.  Counseling provided regarding continued use of seat belts, cessation of alcohol consumption, smoking or use of illicit drugs; infection precautions i.e., influenza/TDAP immunizations, toxoplasmosis,CMV, parvovirus, listeria and varicella; workplace safety, exercise during pregnancy; routine dental care, safe medications, sexual activity, hot tubs, saunas, pools, travel, caffeine use, fish and  methlymercury, potential toxins, hair treatments, varicose veins Weight gain recommendations per IOM guidelines reviewed: underweight/BMI< 18.5--> gain 28 - 40 lbs; normal weight/BMI 18.5 - 24.9--> gain 25 - 35 lbs; overweight/BMI 25 - 29.9--> gain 15 - 25 lbs; obese/BMI >30->gain  11 - 20 lbs Problem list reviewed and updated. FIRST/CF mutation testing/NIPT/QUAD SCREEN/fragile X/Ashkenazi Jewish population testing/Spinal muscular atrophy discussed: requested. Role of ultrasound in pregnancy discussed; fetal survey: requested. Amniocentesis discussed: not indicated. VBAC calculator score: VBAC consent form provided Meds ordered this encounter  Medications  . doxylamine, Sleep, (UNISOM) 25 MG tablet    Sig: Take 25 mg by mouth at bedtime as needed.  . Prenatal-Fe Fum-Methf-FA w/o A (VITAFOL-NANO) 18-0.6-0.4 MG TABS    Sig: Take 1 tablet by mouth daily.    Dispense:  30 tablet    Refill:  12  . clindamycin (CLEOCIN) 100 MG vaginal suppository    Sig: Place 1 suppository (100 mg total) vaginally at bedtime.    Dispense:  6 suppository    Refill:  0  . terconazole (TERAZOL 7) 0.4 % vaginal cream    Sig: Place 1 applicator vaginally at bedtime.    Dispense:  45 g    Refill:  0  . Doxylamine-Pyridoxine (DICLEGIS) 10-10 MG TBEC    Sig: Take 1 tablet with breakfast and lunch.  Take 2 tablets at bedtime.    Dispense:  100 tablet    Refill:  4   Orders Placed This Encounter  Procedures  . Culture, OB Urine  . [place] MFM OB Transvaginal    Standing Status: Future     Number of Occurrences:      Standing Expiration Date: 03/17/2017    Order Specific Question:  Reason for Exam (SYMPTOM  OR DIAGNOSIS REQUIRED)    Answer:  vaginal bleeding in early pregnancy, hx of preeclampsia    Order Specific Question:  Preferred imaging location?    Answer:  [facility]-Ultrasound  . [place] MFM Fetal Nuchal Translucency    Standing Status: Future     Number of Occurrences:      Standing Expiration Date: 03/17/2017     Order Specific Question:  Reason for Exam (SYMPTOM  OR DIAGNOSIS REQUIRED)    Answer:  hx of subchorionic hemorrhage, and preeclampsia    Order Specific Question:  Preferred imaging location?    Answer:  [facility]-Ultrasound  . HIV antibody  . Hemoglobinopathy evaluation  . Varicella zoster antibody, IgG  . VITAMIN D 25 Hydroxy (Vit-D Deficiency, Fractures)  . Prenatal Profile I  . POCT urinalysis dipstick    Follow up in 4 weeks. 50% of 30 min visit spent on counseling and coordination of care.

## 2016-01-18 LAB — URINE CULTURE, OB REFLEX

## 2016-01-18 LAB — CULTURE, OB URINE

## 2016-01-19 LAB — PRENATAL PROFILE I(LABCORP)
ANTIBODY SCREEN: NEGATIVE
BASOS ABS: 0 10*3/uL (ref 0.0–0.2)
Basos: 0 %
EOS (ABSOLUTE): 0.1 10*3/uL (ref 0.0–0.4)
EOS: 1 %
HEMOGLOBIN: 13.1 g/dL (ref 11.1–15.9)
HEP B S AG: NEGATIVE
Hematocrit: 39.2 % (ref 34.0–46.6)
IMMATURE GRANS (ABS): 0 10*3/uL (ref 0.0–0.1)
IMMATURE GRANULOCYTES: 0 %
LYMPHS: 26 %
Lymphocytes Absolute: 2.6 10*3/uL (ref 0.7–3.1)
MCH: 28.2 pg (ref 26.6–33.0)
MCHC: 33.4 g/dL (ref 31.5–35.7)
MCV: 85 fL (ref 79–97)
MONOS ABS: 0.6 10*3/uL (ref 0.1–0.9)
Monocytes: 7 %
NEUTROS PCT: 66 %
Neutrophils Absolute: 6.4 10*3/uL (ref 1.4–7.0)
PLATELETS: 410 10*3/uL — AB (ref 150–379)
RBC: 4.64 x10E6/uL (ref 3.77–5.28)
RDW: 15 % (ref 12.3–15.4)
RH TYPE: POSITIVE
RPR Ser Ql: NONREACTIVE
RUBELLA: 4.82 {index} (ref >0.99)
WBC: 9.7 10*3/uL (ref 3.4–10.8)

## 2016-01-19 LAB — HEMOGLOBINOPATHY EVALUATION
HEMOGLOBIN A2 QUANTITATION: 2.5 % (ref 0.7–3.1)
HGB C: 0 %
HGB S: 0 %
Hemoglobin F Quantitation: 0 % (ref 0.0–2.0)
Hgb A: 97.5 % (ref 94.0–98.0)

## 2016-01-19 LAB — HIV ANTIBODY (ROUTINE TESTING W REFLEX): HIV Screen 4th Generation wRfx: NONREACTIVE

## 2016-01-19 LAB — VITAMIN D 25 HYDROXY (VIT D DEFICIENCY, FRACTURES): Vit D, 25-Hydroxy: 16.6 ng/mL — ABNORMAL LOW (ref 30.0–100.0)

## 2016-01-19 LAB — VARICELLA ZOSTER ANTIBODY, IGG: VARICELLA: 177 {index} (ref >165)

## 2016-01-20 LAB — PAP IG W/ RFLX HPV ASCU: PAP SMEAR COMMENT: 0

## 2016-01-20 LAB — NUSWAB VG, CANDIDA 6SP
CANDIDA ALBICANS, NAA: NEGATIVE
CANDIDA LUSITANIAE, NAA: NEGATIVE
CANDIDA PARAPSILOSIS, NAA: NEGATIVE
CANDIDA TROPICALIS, NAA: NEGATIVE
Candida glabrata, NAA: NEGATIVE
Candida krusei, NAA: NEGATIVE
Trich vag by NAA: NEGATIVE

## 2016-01-21 ENCOUNTER — Encounter (HOSPITAL_COMMUNITY): Payer: Self-pay | Admitting: Certified Nurse Midwife

## 2016-02-06 ENCOUNTER — Encounter (HOSPITAL_COMMUNITY): Payer: Self-pay

## 2016-02-06 ENCOUNTER — Ambulatory Visit (HOSPITAL_COMMUNITY)
Admission: RE | Admit: 2016-02-06 | Discharge: 2016-02-06 | Disposition: A | Discharge status: Home / Self Care | Payer: Medicaid Other | Source: Ambulatory Visit | Attending: Certified Nurse Midwife | Admitting: Certified Nurse Midwife

## 2016-02-06 ENCOUNTER — Ambulatory Visit (HOSPITAL_COMMUNITY): Admission: RE | Admit: 2016-02-06 | Payer: Medicaid Other | Source: Ambulatory Visit

## 2016-02-06 DIAGNOSIS — Z3A12 12 weeks gestation of pregnancy: Secondary | ICD-10-CM | POA: Insufficient documentation

## 2016-02-06 DIAGNOSIS — O209 Hemorrhage in early pregnancy, unspecified: Secondary | ICD-10-CM

## 2016-02-06 DIAGNOSIS — Z36 Encounter for antenatal screening of mother: Secondary | ICD-10-CM | POA: Insufficient documentation

## 2016-02-06 DIAGNOSIS — O09291 Supervision of pregnancy with other poor reproductive or obstetric history, first trimester: Secondary | ICD-10-CM

## 2016-02-13 ENCOUNTER — Ambulatory Visit (INDEPENDENT_AMBULATORY_CARE_PROVIDER_SITE_OTHER): Payer: Medicaid Other | Admitting: Certified Nurse Midwife

## 2016-02-13 VITALS — BP 114/68 | HR 81 | Temp 98.4°F | Wt 176.0 lb

## 2016-02-13 DIAGNOSIS — Z3482 Encounter for supervision of other normal pregnancy, second trimester: Secondary | ICD-10-CM

## 2016-02-13 DIAGNOSIS — Z3492 Encounter for supervision of normal pregnancy, unspecified, second trimester: Secondary | ICD-10-CM

## 2016-02-13 LAB — POCT URINALYSIS DIPSTICK
BILIRUBIN UA: NEGATIVE
Glucose, UA: NEGATIVE
KETONES UA: NEGATIVE
LEUKOCYTES UA: NEGATIVE
Nitrite, UA: NEGATIVE
PH UA: 7
PROTEIN UA: NEGATIVE
RBC UA: NEGATIVE
SPEC GRAV UA: 1.015
Urobilinogen, UA: NEGATIVE

## 2016-02-13 MED ORDER — VITAFOL GUMMIES 3.33-0.333-34.8 MG PO CHEW: 3.0000 | CHEWABLE_TABLET | Freq: Every day | ORAL | Status: DC

## 2016-02-13 NOTE — Progress Notes (Signed)
Patient has no concerns 

## 2016-02-13 NOTE — Progress Notes (Signed)
  Subjective:    Anita Strickland is a [age] y.o. female being seen today for her obstetrical visit. She is at 1357w4d gestation. Patient reports: no complaints.  Problem List Items Addressed This Visit      Other   Supervision of other normal pregnancy, antepartum   Relevant Orders   US OB Comp + 14 Wk    Other Visit Diagnoses    Prenatal care, second trimester    -  Primary    Relevant Orders    POCT urinalysis dipstick (Completed)    US OB Comp + 14 Wk      Patient Active Problem List   Diagnosis Date Noted  . Supervision of other normal pregnancy, antepartum 01/16/2016  . H/O gastric ulcer 10/14/2014  . Smoker 10/10/2014    Objective:     BP 114/68 mmHg  Pulse 81  Temp(Src) 98.4 F (36.9 C)  Wt 176 lb (79.833 kg)  LMP 11/02/2015 (Approximate) Uterine Size: Below umbilicus   FHR: 148  Assessment:    Pregnancy @ 7357w4d  weeks Doing well    Plan:    Problem list reviewed and updated. Labs reviewed.  Follow up in 4 weeks. FIRST/CF mutation testing/NIPT/QUAD SCREEN/fragile X/Ashkenazi Jewish population testing/Spinal muscular atrophy discussed: requested. Role of ultrasound in pregnancy discussed; fetal survey: ordered. Amniocentesis discussed: not indicated. 50% of 15 minute visit spent on counseling and coordination of care.

## 2016-02-25 ENCOUNTER — Telehealth: Payer: Self-pay | Admitting: *Deleted

## 2016-02-25 NOTE — Telephone Encounter (Signed)
No reason for call 6:01 call to patient- she states she has been have pain in her vagina that intensifies when she urinates. She also states she has cramping. She has been nauseated- but no fever,vomiting,or illness. Unsure if symptoms are preterm contractions-but vary similar in comparison. Recommend patient go to MAU to be checked.

## 2016-02-26 ENCOUNTER — Encounter (HOSPITAL_COMMUNITY): Payer: Self-pay

## 2016-02-26 ENCOUNTER — Inpatient Hospital Stay (HOSPITAL_COMMUNITY)
Admission: AD | Admit: 2016-02-26 | Discharge: 2016-02-26 | Disposition: A | Discharge status: Home / Self Care | Payer: Medicaid Other | Source: Ambulatory Visit | Attending: Obstetrics | Admitting: Obstetrics

## 2016-02-26 DIAGNOSIS — Z3A15 15 weeks gestation of pregnancy: Secondary | ICD-10-CM

## 2016-02-26 DIAGNOSIS — O26899 Other specified pregnancy related conditions, unspecified trimester: Secondary | ICD-10-CM

## 2016-02-26 DIAGNOSIS — R102 Pelvic and perineal pain: Secondary | ICD-10-CM | POA: Diagnosis present

## 2016-02-26 DIAGNOSIS — O9989 Other specified diseases and conditions complicating pregnancy, childbirth and the puerperium: Secondary | ICD-10-CM | POA: Diagnosis not present

## 2016-02-26 DIAGNOSIS — O99332 Smoking (tobacco) complicating pregnancy, second trimester: Secondary | ICD-10-CM | POA: Insufficient documentation

## 2016-02-26 DIAGNOSIS — N949 Unspecified condition associated with female genital organs and menstrual cycle: Secondary | ICD-10-CM | POA: Diagnosis not present

## 2016-02-26 DIAGNOSIS — O26892 Other specified pregnancy related conditions, second trimester: Secondary | ICD-10-CM | POA: Insufficient documentation

## 2016-02-26 DIAGNOSIS — F17210 Nicotine dependence, cigarettes, uncomplicated: Secondary | ICD-10-CM | POA: Insufficient documentation

## 2016-02-26 LAB — URINALYSIS, ROUTINE W REFLEX MICROSCOPIC
BILIRUBIN URINE: NEGATIVE
GLUCOSE, UA: NEGATIVE mg/dL
HGB URINE DIPSTICK: NEGATIVE
KETONES UR: NEGATIVE mg/dL
Leukocytes, UA: NEGATIVE
NITRITE: NEGATIVE
PH: 6 (ref 5.0–8.0)
Protein, ur: NEGATIVE mg/dL
Specific Gravity, Urine: 1.02 (ref 1.005–1.030)

## 2016-02-26 LAB — WET PREP, GENITAL
Clue Cells Wet Prep HPF POC: NONE SEEN
Sperm: NONE SEEN
TRICH WET PREP: NONE SEEN
Yeast Wet Prep HPF POC: NONE SEEN

## 2016-02-26 LAB — GC/CHLAMYDIA PROBE AMP (~~LOC~~) NOT AT ARMC
CHLAMYDIA, DNA PROBE: NEGATIVE
Neisseria Gonorrhea: NEGATIVE

## 2016-02-26 NOTE — Discharge Instructions (Signed)

## 2016-02-26 NOTE — MAU Note (Signed)
Pt reports lower back pain , lower abd cramping and pelvic pain since last pm. Denies bleeding , denies dysuria.

## 2016-02-26 NOTE — MAU Provider Note (Signed)
History     CSN: [id number]  Arrival date and time: 02/26/16 0107   First Provider Initiated Contact with Patient 02/26/16 0139      Chief Complaint  Patient presents with  . Pelvic Pain   Pelvic Pain The patient's primary symptoms include pelvic pain. This is a new problem. The current episode started yesterday. The problem occurs constantly. The problem has been unchanged. Pain severity now: 9/10  The problem affects both sides. She is pregnant. Associated symptoms include abdominal pain, nausea and vomiting. Pertinent negatives include no chills, constipation, diarrhea, dysuria, fever, frequency or urgency. The vaginal discharge was normal. There has been no bleeding. Nothing aggravates the symptoms. She has tried acetaminophen for the symptoms. The treatment provided no relief. She is sexually active. It is unknown whether or not her partner has an STD. Her menstrual history has been regular (LMP 11/02/15 ).    Past Medical History  Diagnosis Date  . Pregnancy induced hypertension     Past Surgical History  Procedure Laterality Date  . Abcess drainage      leg  . Esophagogastroduodenoscopy endoscopy      Family History  Problem Relation Age of Onset  . Asthma Brother   . Diabetes Maternal Grandfather   . Hypertension Father     Social History  Substance Use Topics  . Smoking status: Current Every Day Smoker -- 0.10 packs/day    Types: Cigarettes  . Smokeless tobacco: Never Used  . Alcohol Use: No    Allergies: No Known Allergies  Prescriptions prior to admission  Medication Sig Dispense Refill Last Dose  . albuterol (PROVENTIL HFA;VENTOLIN HFA) 108 (90 Base) MCG/ACT inhaler Inhale 1-2 puffs into the lungs every 6 (six) hours as needed for wheezing or shortness of breath.   Taking  . Doxylamine-Pyridoxine (DICLEGIS) 10-10 MG TBEC Take 1 tablet with breakfast and lunch.  Take 2 tablets at bedtime. 100 tablet 4 Taking  . Prenatal Vit-Fe Phos-FA-Omega (VITAFOL  GUMMIES) 3.33-0.333-34.8 MG CHEW Chew 3 tablets by mouth daily. 90 tablet 12   . Prenatal-Fe Fum-Methf-FA w/o A (VITAFOL-NANO) 18-0.6-0.4 MG TABS Take 1 tablet by mouth daily. 30 tablet 12 Taking    Review of Systems  Constitutional: Negative for fever and chills.  Gastrointestinal: Positive for nausea, vomiting and abdominal pain. Negative for diarrhea and constipation.  Genitourinary: Positive for pelvic pain. Negative for dysuria, urgency and frequency.   Physical Exam   Blood pressure 119/70, pulse 84, temperature 98.6 F (37 C), temperature source Oral, resp. rate 16, height 5\' 1"  (1.549 m), weight 79.833 kg (176 lb), last menstrual period 11/02/2015, SpO2 99 %, not currently breastfeeding.  Physical Exam  Nursing note and vitals reviewed. Constitutional: She is oriented to person, place, and time. She appears well-developed and well-nourished. No distress.  HENT:  Head: Normocephalic.  Cardiovascular: Normal rate.   Respiratory: Effort normal.  GI: Soft. There is no tenderness. There is no rebound.  Genitourinary:   External: no lesion Vagina: small amount of white discharge Cervix: pink, smooth, closed/thick  Uterus: AGA, FHT 145 with doppler    Neurological: She is alert and oriented to person, place, and time.  Skin: Skin is warm and dry.  Psychiatric: She has a normal mood and affect.   Results for orders placed or performed during the hospital encounter of 02/26/16 (from the past 24 hour(s))  Urinalysis, Routine w reflex microscopic (not at [facility])     Status: None   Collection Time: 02/26/16  1:18 AM  Result Value Ref Range   Color, Urine YELLOW YELLOW   APPearance CLEAR CLEAR   Specific Gravity, Urine 1.020 1.005 - 1.030   pH 6.0 5.0 - 8.0   Glucose, UA NEGATIVE NEGATIVE mg/dL   Hgb urine dipstick NEGATIVE NEGATIVE   Bilirubin Urine NEGATIVE NEGATIVE   Ketones, ur NEGATIVE NEGATIVE mg/dL   Protein, ur NEGATIVE NEGATIVE mg/dL   Nitrite NEGATIVE NEGATIVE    Leukocytes, UA NEGATIVE NEGATIVE  Wet prep, genital     Status: Abnormal   Collection Time: 02/26/16  1:48 AM  Result Value Ref Range   Yeast Wet Prep HPF POC NONE SEEN NONE SEEN   Trich, Wet Prep NONE SEEN NONE SEEN   Clue Cells Wet Prep HPF POC NONE SEEN NONE SEEN   WBC, Wet Prep HPF POC MODERATE (A) NONE SEEN   Sperm NONE SEEN     MAU Course  Procedures  MDM  Assessment and Plan   1. Pain of round ligament affecting pregnancy, antepartum   2. [redacted] weeks gestation of pregnancy    DC home Comfort measures reviewed  2nd Trimester precautions  RX: none  Return to MAU as needed FU with OB as planned  Follow-up Information    Follow up with Brock Bad, MD.   Specialty:  Obstetrics and Gynecology   Why:  As scheduled   Contact information:   [address] [place] [address] [phone]         Tawnya Crook 02/26/2016, 1:40 AM

## 2016-03-04 ENCOUNTER — Other Ambulatory Visit: Payer: Self-pay | Admitting: Certified Nurse Midwife

## 2016-03-12 ENCOUNTER — Encounter: Payer: Medicaid Other | Admitting: Certified Nurse Midwife

## 2016-03-18 ENCOUNTER — Other Ambulatory Visit: Payer: Medicaid Other

## 2016-03-18 ENCOUNTER — Ambulatory Visit (INDEPENDENT_AMBULATORY_CARE_PROVIDER_SITE_OTHER): Payer: Medicaid Other

## 2016-03-18 ENCOUNTER — Ambulatory Visit (INDEPENDENT_AMBULATORY_CARE_PROVIDER_SITE_OTHER): Payer: Medicaid Other | Admitting: Certified Nurse Midwife

## 2016-03-18 VITALS — BP 116/76 | HR 78 | Wt 175.0 lb

## 2016-03-18 DIAGNOSIS — Z3482 Encounter for supervision of other normal pregnancy, second trimester: Secondary | ICD-10-CM

## 2016-03-18 DIAGNOSIS — Z36 Encounter for antenatal screening of mother: Secondary | ICD-10-CM | POA: Diagnosis not present

## 2016-03-18 DIAGNOSIS — Z3492 Encounter for supervision of normal pregnancy, unspecified, second trimester: Secondary | ICD-10-CM

## 2016-03-18 LAB — POCT URINALYSIS DIPSTICK
Bilirubin, UA: NEGATIVE
Glucose, UA: NEGATIVE
KETONES UA: NEGATIVE
Leukocytes, UA: NEGATIVE
Nitrite, UA: NEGATIVE
PROTEIN UA: NEGATIVE
RBC UA: NEGATIVE
SPEC GRAV UA: 1.01
UROBILINOGEN UA: NEGATIVE
pH, UA: 6

## 2016-03-18 NOTE — Progress Notes (Signed)
Subjective:    Anita Strickland is a 25 y.o. female being seen today for her obstetrical visit. She is at 1911w3d gestation. Patient reports: no complaints . Fetal movement: normal.  Problem List Items Addressed This Visit      Other   Supervision of other normal pregnancy, antepartum   Relevant Orders   MaterniT21 PLUS Core+SCA    Other Visit Diagnoses    Prenatal care, second trimester    -  Primary    Relevant Orders    POCT urinalysis dipstick (Completed)      Patient Active Problem List   Diagnosis Date Noted  . Supervision of other normal pregnancy, antepartum 01/16/2016  . H/O gastric ulcer 10/14/2014  . Smoker 10/10/2014   Objective:    BP 116/76 mmHg  Pulse 78  Wt 175 lb (79.379 kg)  LMP 11/02/2015 (Approximate) FHT: 153 BPM  Uterine Size: size equals dates     Assessment:    Pregnancy @ 6011w3d    Doing well  Plan:    OBGCT: discussed. Signs and symptoms of preterm labor: discussed.  Labs, problem list reviewed and updated 2 hr GTT planned Follow up in 4 weeks.

## 2016-03-19 ENCOUNTER — Encounter: Payer: Medicaid Other | Admitting: Certified Nurse Midwife

## 2016-03-24 ENCOUNTER — Encounter (HOSPITAL_COMMUNITY): Payer: Self-pay

## 2016-03-24 ENCOUNTER — Inpatient Hospital Stay (HOSPITAL_COMMUNITY)
Admission: AD | Admit: 2016-03-24 | Discharge: 2016-03-24 | Disposition: A | Discharge status: Home / Self Care | Payer: Medicaid Other | Source: Ambulatory Visit | Attending: Obstetrics | Admitting: Obstetrics

## 2016-03-24 DIAGNOSIS — Z3A19 19 weeks gestation of pregnancy: Secondary | ICD-10-CM | POA: Diagnosis not present

## 2016-03-24 DIAGNOSIS — O99332 Smoking (tobacco) complicating pregnancy, second trimester: Secondary | ICD-10-CM | POA: Insufficient documentation

## 2016-03-24 DIAGNOSIS — H1033 Unspecified acute conjunctivitis, bilateral: Secondary | ICD-10-CM | POA: Insufficient documentation

## 2016-03-24 DIAGNOSIS — J029 Acute pharyngitis, unspecified: Secondary | ICD-10-CM | POA: Diagnosis present

## 2016-03-24 DIAGNOSIS — H5710 Ocular pain, unspecified eye: Secondary | ICD-10-CM | POA: Diagnosis present

## 2016-03-24 DIAGNOSIS — O26892 Other specified pregnancy related conditions, second trimester: Secondary | ICD-10-CM | POA: Diagnosis not present

## 2016-03-24 DIAGNOSIS — O9989 Other specified diseases and conditions complicating pregnancy, childbirth and the puerperium: Secondary | ICD-10-CM | POA: Diagnosis not present

## 2016-03-24 DIAGNOSIS — J069 Acute upper respiratory infection, unspecified: Secondary | ICD-10-CM

## 2016-03-24 LAB — URINALYSIS, ROUTINE W REFLEX MICROSCOPIC
Bilirubin Urine: NEGATIVE
GLUCOSE, UA: NEGATIVE mg/dL
HGB URINE DIPSTICK: NEGATIVE
Ketones, ur: NEGATIVE mg/dL
Leukocytes, UA: NEGATIVE
Nitrite: NEGATIVE
PH: 6.5 (ref 5.0–8.0)
PROTEIN: NEGATIVE mg/dL
Specific Gravity, Urine: 1.015 (ref 1.005–1.030)

## 2016-03-24 LAB — INFLUENZA PANEL BY PCR (TYPE A & B)
H1N1 flu by pcr: NOT DETECTED
Influenza A By PCR: NEGATIVE
Influenza B By PCR: NEGATIVE

## 2016-03-24 MED ORDER — ACETAMINOPHEN 325 MG PO TABS
650.0000 mg | ORAL_TABLET | Freq: Once | ORAL | Status: AC
  Administered 2016-03-24: 650 mg via ORAL
  Filled 2016-03-24: qty 2

## 2016-03-24 MED ORDER — ERYTHROMYCIN 5 MG/GM OP OINT: TOPICAL_OINTMENT | OPHTHALMIC | Status: AC

## 2016-03-24 NOTE — MAU Note (Signed)
Pt presents complaining of throat pain, eye pain, sometimes abdominal pain, feeling cold and chills. Has not taken temperature at home. Denies bleeding or abnormal discharge.

## 2016-03-24 NOTE — MAU Provider Note (Signed)
History     CSN: [id number]  Arrival date and time: 03/24/16 4098   First Provider Initiated Contact with Patient 03/24/16 0220        Chief Complaint  Patient presents with  . Eye Pain  . Sore Throat   HPI  Anita Strickland is a 25 y.o. G3P1011 at [redacted]w[redacted]d who presents with cold symptoms. Symptoms began this morning. Reports sore throat, body aches, eye drainage, and cough. Throat pain worse when swallowing but is able to swallow. Rates pain 5/10. Reports mild cough that is nonproductive. Denies wheezing or SOB. States whole body aches; denies fever. Rates body aches 5/10. This morning woke up with drainage in bilateral eyes and right eye crusted shut. Used visine drops that improved eye redness but not irritation. Denies sick contacts.  Denies abdominal pain or vaginal bleeding.  Has not taken any medication to treat symptoms.    OB History    Gravida Para Term Preterm AB TAB SAB Ectopic Multiple Living   0 1 1 0 0 0 1      Past Medical History  Diagnosis Date  . Pregnancy induced hypertension     Past Surgical History  Procedure Laterality Date  . Abcess drainage      leg  . Esophagogastroduodenoscopy endoscopy      Family History  Problem Relation Age of Onset  . Asthma Brother   . Diabetes Maternal Grandfather   . Hypertension Father     Social History  Substance Use Topics  . Smoking status: Current Every Day Smoker -- 0.10 packs/day    Types: Cigarettes  . Smokeless tobacco: Never Used  . Alcohol Use: No    Allergies: No Known Allergies  Prescriptions prior to admission  Medication Sig Dispense Refill Last Dose  . albuterol (PROVENTIL HFA;VENTOLIN HFA) 108 (90 Base) MCG/ACT inhaler Inhale 1-2 puffs into the lungs every 6 (six) hours as needed for wheezing or shortness of breath.   Taking  . Prenatal Vit-Fe Phos-FA-Omega (VITAFOL GUMMIES) 3.33-0.333-34.8 MG CHEW Chew 3 tablets by mouth daily. 90 tablet 12 Taking  . Prenatal-Fe Fum-Methf-FA w/o A  (VITAFOL-NANO) 18-0.6-0.4 MG TABS Take 1 tablet by mouth daily. 30 tablet 12 Taking    Review of Systems  Constitutional: Positive for chills. Negative for fever.  HENT: Positive for sore throat. Negative for congestion and ear pain.   Eyes: Positive for discharge and redness. Negative for blurred vision and pain.  Respiratory: Positive for cough. Negative for sputum production, shortness of breath and wheezing.   Cardiovascular: Negative.   Gastrointestinal: Positive for nausea. Negative for vomiting, abdominal pain, diarrhea and constipation.  Genitourinary: Negative.   Musculoskeletal: Positive for myalgias.   Physical Exam   Blood pressure 117/65, pulse 103, temperature 99.6 F (37.6 C), temperature source Oral, resp. rate 18, last menstrual period 11/02/2015, not currently breastfeeding.  Physical Exam  Nursing note and vitals reviewed. Constitutional: She is oriented to person, place, and time. She appears well-developed and well-nourished. No distress.  HENT:  Head: Normocephalic and atraumatic.  Right Ear: External ear normal.  Left Ear: External ear normal.  Nose: Nose normal.  Mouth/Throat: Oropharynx is clear and moist. No oropharyngeal exudate.  Eyes: EOM are normal. Pupils are equal, round, and reactive to light. Right eye exhibits discharge (minimal amount of thick yellow discharge). Left eye exhibits no discharge. Right conjunctiva is injected. Left conjunctiva is injected. No scleral icterus.  Neck: Normal range of motion. Neck supple.  Cardiovascular: Normal rate, regular  rhythm and normal heart sounds.   No murmur heard. Respiratory: Effort normal and breath sounds normal. No respiratory distress. She has no wheezes.  GI: Soft.  Lymphadenopathy:    She has no cervical adenopathy.  Neurological: She is alert and oriented to person, place, and time.  Skin: Skin is warm and dry. She is not diaphoretic.  Psychiatric: She has a normal mood and affect. Her behavior is  normal. Judgment and thought content normal.    MAU Course  Procedures Results for orders placed or performed during the hospital encounter of 03/24/16 (from the past 72 hour(s))  Urinalysis, Routine w reflex microscopic (not at [facility])     Status: None   Collection Time: 03/24/16  1:40 AM  Result Value Ref Range   Color, Urine YELLOW YELLOW   APPearance CLEAR CLEAR   Specific Gravity, Urine 1.015 1.005 - 1.030   pH 6.5 5.0 - 8.0   Glucose, UA NEGATIVE NEGATIVE mg/dL   Hgb urine dipstick NEGATIVE NEGATIVE   Bilirubin Urine NEGATIVE NEGATIVE   Ketones, ur NEGATIVE NEGATIVE mg/dL   Protein, ur NEGATIVE NEGATIVE mg/dL   Nitrite NEGATIVE NEGATIVE   Leukocytes, UA NEGATIVE NEGATIVE    Comment: MICROSCOPIC NOT DONE ON URINES WITH NEGATIVE PROTEIN, BLOOD, LEUKOCYTES, NITRITE, OR GLUCOSE <1000 mg/dL.  Influenza panel by PCR (type A & B, H1N1)     Status: None   Collection Time: 03/24/16  2:30 AM  Result Value Ref Range   Influenza A By PCR NEGATIVE NEGATIVE   Influenza B By PCR NEGATIVE NEGATIVE   H1N1 flu by pcr NOT DETECTED NOT DETECTED    Comment:        The Xpert Flu assay (FDA approved for nasal aspirates or washes and nasopharyngeal swab specimens), is intended as an aid in the diagnosis of influenza and should not be used as a sole basis for treatment. [facility]   Culture, group A strep     Status: None (Preliminary result)   Collection Time: 03/24/16  2:36 AM  Result Value Ref Range   Specimen Description THROAT    Special Requests NONE    Culture      CULTURE REINCUBATED FOR BETTER GROWTH Performed at [facility]    Report Status PENDING     MDM FHT 163 by doppler VSS Tylenol 650 mg PO Strep & flu swabs obtained  Assessment and Plan  A: 1. Pharyngitis   2. Acute bacterial conjunctivitis of both eyes     P; Discharge home Rx erythromycin eye ointment Discussed symptomatic treatment of symptoms Strep & flu swabs pending    Judeth Hornrin Treyden Hakim 03/24/2016, 2:19 AM

## 2016-03-24 NOTE — Discharge Instructions (Signed)
Pharyngitis Pharyngitis is a sore throat (pharynx). There is redness, pain, and swelling of your throat. HOME CARE  1. Drink enough fluids to keep your pee (urine) clear or pale yellow. 2. Only take medicine as told by your doctor. 1. You may get sick again if you do not take medicine as told. Finish your medicines, even if you start to feel better. 2. Do not take aspirin. 3. Rest. 4. Rinse your mouth (gargle) with salt water ( tsp of salt per 1 qt of water) every 1-2 hours. This will help the pain. 5. If you are not at risk for choking, you can suck on hard candy or sore throat lozenges. GET HELP IF: 1. You have large, tender lumps on your neck. 2. You have a rash. 3. You cough up green, yellow-brown, or bloody spit. GET HELP RIGHT AWAY IF:   You have a stiff neck.  You drool or cannot swallow liquids.  You throw up (vomit) or are not able to keep medicine or liquids down.  You have very bad pain that does not go away with medicine.  You have problems breathing (not from a stuffy nose). MAKE SURE YOU:   Understand these instructions.  Will watch your condition.  Will get help right away if you are not doing well or get worse.   This information is not intended to replace advice given to you by your health care provider. Make sure you discuss any questions you have with your health care provider.   Document Released: 04/05/2008 Document Revised: 08/08/2013 Document Reviewed: 06/25/2013 Elsevier Interactive Patient Education 2016 Elsevier Inc.  Bacterial Conjunctivitis Bacterial conjunctivitis (commonly called pink eye) is redness, soreness, or puffiness (inflammation) of the white part of your eye. It is caused by a germ called bacteria. These germs can easily spread from person to person (contagious). Your eye often will become red or pink. Your eye may also become irritated, watery, or have a thick discharge.  HOME CARE  6. Apply a cool, clean washcloth over closed  eyelids. Do this for 10-20 minutes, 3-4 times a day while you have pain. 7. Gently wipe away any fluid coming from the eye with a warm, wet washcloth or cotton ball. 8. Wash your hands often with soap and water. Use paper towels to dry your hands. 9. Do not share towels or washcloths. 10. Change or wash your pillowcase every day. 11. Do not use eye makeup until the infection is gone. 12. Do not use machines or drive if your vision is blurry. 13. Stop using contact lenses. Do not use them again until your doctor says it is okay. 14. Do not touch the tip of the eye drop bottle or medicine tube with your fingers when you put medicine on the eye. GET HELP RIGHT AWAY IF:  4. Your eye is not better after 3 days of starting your medicine. 5. You have a yellowish fluid coming out of the eye. 6. You have more pain in the eye. 7. Your eye redness is spreading. 8. Your vision becomes blurry. 9. You have a fever or lasting symptoms for more than 2-3 days. 10. You have a fever and your symptoms suddenly get worse. 11. You have pain in the face. 12. Your face gets red or puffy (swollen). MAKE SURE YOU:   Understand these instructions.  Will watch this condition.  Will get help right away if you are not doing well or get worse.   This information is not intended to  replace advice given to you by your health care provider. Make sure you discuss any questions you have with your health care provider.   Document Released: 07/27/2008 Document Revised: 10/04/2012 Document Reviewed: 06/23/2012 Elsevier Interactive Patient Education 2016 [employer]  How to Use Eye Drops and Eye Ointments HOW TO APPLY EYE DROPS Follow these steps when applying eye drops: 15. Wash your hands. 16. Tilt your head back. 17. Put a finger under your eye and use it to gently pull your lower lid downward. Keep that finger in place. 18. Using your other hand, hold the dropper between your thumb and index  finger. 19. Position the dropper just over the edge of the lower lid. Hold it as close to your eye as you can without touching the dropper to your eye. 20. Steady your hand. One way to do this is to lean your index finger against your brow. 21. Look up. 22. Slowly and gently squeeze one drop of medicine into your eye. 23. Close your eye. 24. Place a finger between your lower eyelid and your nose. Press gently for 2 minutes. This increases the amount of time that the medicine is exposed to the eye. It also reduces side effects that can develop if the drop gets into the bloodstream through the nose. HOW TO APPLY EYE OINTMENTS Follow these steps when applying eye ointments: 13. Wash your hands. 14. Put a finger under your eye and use it to gently pull your lower lid downward. Keep that finger in place. 15. Using your other hand, place the tip of the tube between your thumb and index finger with the remaining fingers braced against your cheek or nose. 16. Hold the tube just over the edge of your lower lid without touching the tube to your lid or eyeball. 17. Look up. 18. Line the inner part of your lower lid with ointment. 19. Gently pull up on your upper lid and look down. This will force the ointment to spread over the surface of the eye. 20. Release the upper lid. 21. If you can, close your eyes for 1-2 minutes. Do not rub your eyes. If you applied the ointment correctly, your vision will be blurry for a few minutes. This is normal. ADDITIONAL INFORMATION  Make sure to use the eye drops or ointment as told by your health care provider.  If you have been told to use both eye drops and an eye ointment, apply the eye drops first, then wait 3-4 minutes before you apply the ointment.  Try not to touch the tip of the dropper or tube to your eye. A dropper or tube that has touched the eye can become contaminated.   This information is not intended to replace advice given to you by your health care  provider. Make sure you discuss any questions you have with your health care provider.   Document Released: 01/24/2001 Document Revised: 03/04/2015 Document Reviewed: 10/14/2014 Elsevier Interactive Patient Education [employer] Elsevier Inc.

## 2016-03-26 ENCOUNTER — Encounter (HOSPITAL_COMMUNITY): Payer: Self-pay | Admitting: *Deleted

## 2016-03-26 ENCOUNTER — Emergency Department (HOSPITAL_COMMUNITY)
Admission: EM | Admit: 2016-03-26 | Discharge: 2016-03-27 | Disposition: A | Discharge status: Home / Self Care | Payer: Medicaid Other | Attending: Emergency Medicine | Admitting: Emergency Medicine

## 2016-03-26 DIAGNOSIS — Z3A19 19 weeks gestation of pregnancy: Secondary | ICD-10-CM | POA: Insufficient documentation

## 2016-03-26 DIAGNOSIS — Z79899 Other long term (current) drug therapy: Secondary | ICD-10-CM | POA: Diagnosis not present

## 2016-03-26 DIAGNOSIS — O99332 Smoking (tobacco) complicating pregnancy, second trimester: Secondary | ICD-10-CM | POA: Insufficient documentation

## 2016-03-26 DIAGNOSIS — J029 Acute pharyngitis, unspecified: Secondary | ICD-10-CM | POA: Insufficient documentation

## 2016-03-26 DIAGNOSIS — H65192 Other acute nonsuppurative otitis media, left ear: Secondary | ICD-10-CM | POA: Diagnosis not present

## 2016-03-26 DIAGNOSIS — F17210 Nicotine dependence, cigarettes, uncomplicated: Secondary | ICD-10-CM | POA: Diagnosis not present

## 2016-03-26 DIAGNOSIS — O99352 Diseases of the nervous system complicating pregnancy, second trimester: Secondary | ICD-10-CM | POA: Insufficient documentation

## 2016-03-26 DIAGNOSIS — H6692 Otitis media, unspecified, left ear: Secondary | ICD-10-CM

## 2016-03-26 DIAGNOSIS — O99512 Diseases of the respiratory system complicating pregnancy, second trimester: Secondary | ICD-10-CM | POA: Insufficient documentation

## 2016-03-26 MED ORDER — AMOXICILLIN 500 MG PO CAPS: 500.0000 mg | ORAL_CAPSULE | Freq: Two times a day (BID) | ORAL | Status: DC

## 2016-03-26 NOTE — ED Notes (Signed)
Pt verbalized understanding of d/c instructions and has no further questions. Pt stable and NAD.  

## 2016-03-26 NOTE — Discharge Instructions (Signed)
Take your antibiotics as prescribed. I recommend continuing to drink fluids at home to remain hydrated. You may also use a warm mist humidifier, drink warm fluids with honey to help with her cough and nasal congestion. Follow-up with your primary care provider in the next 4-5 days if your symptoms have not improved. Please return to the Emergency Department if symptoms worsen or new onset of fever, facial swelling, loss of hearing, ear drainage, unable to swallow, difficulty breathing, productive cough, coughing up blood, chest pain, headache, neck stiffness.

## 2016-03-26 NOTE — ED Notes (Signed)
The pt is c/o lt ear pain for one week. [facility] for the  Same thing  She is 19 weeks preg

## 2016-03-26 NOTE — ED Provider Notes (Signed)
CSN: [id number]     Arrival date & time 03/26/16  2150 History  By signing my name below, I, Linna DarnerRussell Turner, attest that this documentation has been prepared under the direction and in the presence of non-physician practitioner, Melburn HakeNicole Glenroy Crossen, PA-C. Electronically Signed: Linna Darnerussell Turner, Scribe. 03/26/2016. 10:56 PM.   Chief Complaint  Patient presents with  . Otalgia    The history is provided by the patient. No language interpreter was used.     HPI Comments: Anita Strickland is a 25 y.o. female who presents to the Emergency Department complaining of sudden onset, constant, left ear pain beginning a few hours ago. Pt states that it feels like there is something in her left ear. She endorses associated pressure and muffled hearing. Pt was seen in the ER two days ago for various cold-like symptoms; she notes sore throat, neck pain, headache, nasal congestion, and productive cough for the last week. She was given erythromycin eye ointment in the ER for bacterial conjunctivitis. Pt reports that she has been around a baby recently with cold-like symptoms. She states that her son had an ear infection two weeks ago. Pt has not been swimming or on a plane recently. She has not used q-tips in her left ear recently and denies other ear trauma. She has taken Tylenol for her symptoms with no relief. Pt has no known medical problems. She is [redacted] weeks pregnant with no complications. She denies left ear drainage, fever, SOB, wheezing, vomiting, or any other associated symptoms.  Chart review shows pt was [facility] on 03/24/16 for sxs described above, negative for flu with strep test pending. Pt was diagnosed with pharyngitis and acute bacterial conjunctivitis. She was prescribed Erythromycin eye ointment and symptomatic treatment.  Past Medical History  Diagnosis Date  . Pregnancy induced hypertension    Past Surgical History  Procedure Laterality Date  . Abcess drainage      leg  .  Esophagogastroduodenoscopy endoscopy     Family History  Problem Relation Age of Onset  . Asthma Brother   . Diabetes Maternal Grandfather   . Hypertension Father    Social History  Substance Use Topics  . Smoking status: Current Every Day Smoker -- 0.10 packs/day    Types: Cigarettes  . Smokeless tobacco: Never Used  . Alcohol Use: No   OB History    Gravida Para Term Preterm AB TAB SAB Ectopic Multiple Living   3 1 1  0 1 1 0 0 0 1     Review of Systems  Constitutional: Negative for fever.  HENT: Positive for congestion, ear pain (left), hearing loss (left ear "muffled") and sore throat. Negative for ear discharge.   Eyes: Positive for redness.  Respiratory: Positive for cough (productive). Negative for shortness of breath and wheezing.   Gastrointestinal: Negative for vomiting.  Neurological: Positive for headaches.   Allergies  Review of patient's allergies indicates no known allergies.  Home Medications   Prior to Admission medications   Medication Sig Start Date End Date Taking? Authorizing Provider  acetaminophen (TYLENOL) 325 MG tablet Take 650 mg by mouth every 6 (six) hours as needed.   Yes Historical Provider, MD  albuterol (PROVENTIL HFA;VENTOLIN HFA) 108 (90 Base) MCG/ACT inhaler Inhale 1-2 puffs into the lungs every 6 (six) hours as needed for wheezing or shortness of breath.   Yes Historical Provider, MD  erythromycin ophthalmic ointment Place a 1/2 inch ribbon of ointment into the lower eyelid 4 times per day x 7  days 03/24/16 03/31/16 Yes Judeth Horn, NP  Prenatal-Fe Fum-Methf-FA w/o A (VITAFOL-NANO) 18-0.6-0.4 MG TABS Take 1 tablet by mouth daily.   Yes Historical Provider, MD  amoxicillin (AMOXIL) 500 MG capsule Take 1 capsule (500 mg total) by mouth 2 (two) times daily. 03/26/16   Satira Sark Wilmetta Speiser, PA-C   BP 117/64 mmHg  Pulse 82  Temp(Src) 98.7 F (37.1 C) (Oral)  Resp 18  Ht  (1.549 m)  Wt 78.671 kg  BMI 32.79 kg/m2  SpO2 100%  LMP  11/02/2015 (Approximate) Physical Exam  Constitutional: She is oriented to person, place, and time. She appears well-developed and well-nourished.  HENT:  Head: Normocephalic and atraumatic.  Right Ear: Hearing, tympanic membrane, external ear and ear canal normal.  Left Ear: Hearing and ear canal normal. No mastoid tenderness. Tympanic membrane is injected. A middle ear effusion is present.  Nose: Nose normal. Right sinus exhibits no maxillary sinus tenderness and no frontal sinus tenderness. Left sinus exhibits no maxillary sinus tenderness and no frontal sinus tenderness.  Mouth/Throat: Uvula is midline and mucous membranes are normal. Posterior oropharyngeal erythema present. No oropharyngeal exudate, posterior oropharyngeal edema or tonsillar abscesses.  Eyes: Conjunctivae and EOM are normal. Right eye exhibits no discharge. Left eye exhibits no discharge. No scleral icterus.  Neck: Normal range of motion. Neck supple.  Cardiovascular: Normal rate, regular rhythm, normal heart sounds and intact distal pulses.   Pulmonary/Chest: Effort normal and breath sounds normal. No respiratory distress. She has no wheezes. She has no rales. She exhibits no tenderness.  Lymphadenopathy:    She has no cervical adenopathy.  Neurological: She is alert and oriented to person, place, and time.  Nursing note and vitals reviewed.   ED Course  Procedures (including critical care time)  DIAGNOSTIC STUDIES: Oxygen Saturation is 98% on RA, normal by my interpretation.    COORDINATION OF CARE: 10:56 PM Discussed treatment plan with pt at bedside and pt agreed to plan.  Labs Review Labs Reviewed - No data to display  Imaging Review No results found. I have personally reviewed and evaluated these images and lab results as part of my medical decision-making.   EKG Interpretation None      MDM   Final diagnoses:  Acute left otitis media, recurrence not specified, unspecified otitis media type   Pharyngitis    Patient presents with otalgia and exam consistent with acute otitis media. No concern for acute mastoiditis, meningitis. No antibiotic use in the last month.  Patient discharged home with Amoxicillin. Discussed with pharmacy regarding antibiotic treatment, pharmacy reports amoxicillin is safe to use throughout pregnancy. Discussed symptomatic treatment for suspected viral pharyngitis with patient. Advised to follow-up with her PCP and 4-5 days as needed.  I have also discussed reasons to return immediately to the ER.  Pt expresses understanding and agrees with plan.  I personally performed the services described in this documentation, which was scribed in my presence. The recorded information has been reviewed and is accurate.   Satira Sark [place], [place] 03/27/16 0030  Marily Memos, MD 03/27/16 2232

## 2016-03-27 LAB — CULTURE, GROUP A STREP (THRC)

## 2016-03-28 LAB — MATERNIT21 PLUS CORE+SCA
CHROMOSOME 13: NEGATIVE
CHROMOSOME 18: NEGATIVE
CHROMOSOME 21: NEGATIVE
PDF: 0
Y CHROMOSOME: DETECTED

## 2016-03-30 ENCOUNTER — Other Ambulatory Visit: Payer: Self-pay | Admitting: Certified Nurse Midwife

## 2016-04-16 ENCOUNTER — Ambulatory Visit (INDEPENDENT_AMBULATORY_CARE_PROVIDER_SITE_OTHER): Payer: Medicaid Other | Admitting: Certified Nurse Midwife

## 2016-04-16 ENCOUNTER — Other Ambulatory Visit: Payer: Self-pay | Admitting: Certified Nurse Midwife

## 2016-04-16 VITALS — BP 107/65 | HR 85 | Temp 98.4°F | Wt 175.0 lb

## 2016-04-16 DIAGNOSIS — Z3492 Encounter for supervision of normal pregnancy, unspecified, second trimester: Secondary | ICD-10-CM

## 2016-04-16 LAB — POCT URINALYSIS DIPSTICK
BILIRUBIN UA: NEGATIVE
GLUCOSE UA: NEGATIVE
Ketones, UA: NEGATIVE
Leukocytes, UA: NEGATIVE
NITRITE UA: NEGATIVE
Protein, UA: NEGATIVE
RBC UA: NEGATIVE
Spec Grav, UA: 1.01
Urobilinogen, UA: NEGATIVE
pH, UA: 7

## 2016-04-16 NOTE — Progress Notes (Signed)
Subjective:    Anita Strickland is a 25 y.o. female being seen today for her obstetrical visit. She is at 4425w4d gestation. Patient reports: no bleeding, no contractions, no cramping, no leaking and vaginal irritation . Fetal movement: normal.  Problem List Items Addressed This Visit    None     Patient Active Problem List   Diagnosis Date Noted  . Supervision of other normal pregnancy, antepartum 01/16/2016  . H/O gastric ulcer 10/14/2014  . Smoker 10/10/2014   Objective:    BP 107/65 mmHg  Pulse 85  Temp(Src) 98.4 F (36.9 C)  Wt 175 lb (79.379 kg)  LMP 11/02/2015 (Approximate) FHT: 150 BPM  Uterine Size: size equals dates     Assessment:    Pregnancy @ 7125w4d    Plan:    OBGCT: discussed. Signs and symptoms of preterm labor: discussed.  Labs, problem list reviewed and updated 2 hr GTT planned Follow up in 4 weeks.

## 2016-04-20 LAB — NUSWAB VG+, CANDIDA 6SP
CANDIDA ALBICANS, NAA: NEGATIVE
CHLAMYDIA TRACHOMATIS, NAA: NEGATIVE
Candida glabrata, NAA: NEGATIVE
Candida krusei, NAA: NEGATIVE
Candida lusitaniae, NAA: NEGATIVE
Candida parapsilosis, NAA: NEGATIVE
Candida tropicalis, NAA: NEGATIVE
NEISSERIA GONORRHOEAE, NAA: NEGATIVE
TRICH VAG BY NAA: NEGATIVE

## 2016-04-29 ENCOUNTER — Encounter: Payer: Medicaid Other | Admitting: Certified Nurse Midwife

## 2016-05-06 ENCOUNTER — Ambulatory Visit (INDEPENDENT_AMBULATORY_CARE_PROVIDER_SITE_OTHER): Payer: Medicaid Other | Admitting: Certified Nurse Midwife

## 2016-05-06 VITALS — BP 110/68 | HR 74 | Wt 177.0 lb

## 2016-05-06 DIAGNOSIS — N760 Acute vaginitis: Secondary | ICD-10-CM

## 2016-05-06 DIAGNOSIS — B373 Candidiasis of vulva and vagina: Secondary | ICD-10-CM

## 2016-05-06 DIAGNOSIS — B3731 Acute candidiasis of vulva and vagina: Secondary | ICD-10-CM

## 2016-05-06 DIAGNOSIS — A499 Bacterial infection, unspecified: Secondary | ICD-10-CM

## 2016-05-06 DIAGNOSIS — O26842 Uterine size-date discrepancy, second trimester: Secondary | ICD-10-CM

## 2016-05-06 DIAGNOSIS — B9689 Other specified bacterial agents as the cause of diseases classified elsewhere: Secondary | ICD-10-CM

## 2016-05-06 MED ORDER — FLUCONAZOLE 100 MG PO TABS: 100.0000 mg | ORAL_TABLET | Freq: Once | ORAL | Status: DC

## 2016-05-06 MED ORDER — METRONIDAZOLE 500 MG PO TABS: 500.0000 mg | ORAL_TABLET | Freq: Two times a day (BID) | ORAL | Status: DC

## 2016-05-06 MED ORDER — TERCONAZOLE 0.8 % VA CREA: 1.0000 | TOPICAL_CREAM | Freq: Every day | VAGINAL | Status: DC

## 2016-05-06 NOTE — Progress Notes (Signed)
Subjective:    Mack HookBreanna Seyller is a 25 y.o. female being seen today for her obstetrical visit. She is at 594w3d gestation. Patient reports: no bleeding, no contractions, no cramping, no leaking and vaginal irritation . Fetal movement: normal.  Problem List Items Addressed This Visit    None    Visit Diagnoses    Yeast vaginitis    -  Primary    Relevant Medications    metroNIDAZOLE (FLAGYL) 500 MG tablet    fluconazole (DIFLUCAN) 100 MG tablet    terconazole (TERAZOL 3) 0.8 % vaginal cream    BV (bacterial vaginosis)        Relevant Medications    metroNIDAZOLE (FLAGYL) 500 MG tablet    fluconazole (DIFLUCAN) 100 MG tablet    Uterine size date discrepancy pregnancy, second trimester        Relevant Orders    US OB Limited      Patient Active Problem List   Diagnosis Date Noted  . Supervision of other normal pregnancy, antepartum 01/16/2016  . H/O gastric ulcer 10/14/2014  . Smoker 10/10/2014   Objective:    BP 110/68 mmHg  Pulse 74  Wt 177 lb (80.287 kg)  LMP 11/02/2015 (Approximate) FHT: 145 BPM  Uterine Size: 27 cm and size greater than dates     Assessment:    Pregnancy @ 364w3d    BV/Vaginitis  S>D  Plan:    OBGCT: discussed and ordered for next visit. Signs and symptoms of preterm labor: discussed.  Labs, problem list reviewed and updated 2 hr GTT planned Follow up in 3 weeks.

## 2016-05-06 NOTE — Addendum Note (Signed)
Addended by: Marya LandryFOSTER, Rhianne Soman D on: 05/06/2016 05:15 PM   Modules accepted: Orders

## 2016-05-10 ENCOUNTER — Ambulatory Visit (INDEPENDENT_AMBULATORY_CARE_PROVIDER_SITE_OTHER): Payer: Medicaid Other

## 2016-05-10 ENCOUNTER — Other Ambulatory Visit: Payer: Self-pay | Admitting: Certified Nurse Midwife

## 2016-05-10 DIAGNOSIS — O26842 Uterine size-date discrepancy, second trimester: Secondary | ICD-10-CM

## 2016-05-11 ENCOUNTER — Other Ambulatory Visit: Payer: Self-pay | Admitting: Certified Nurse Midwife

## 2016-05-14 ENCOUNTER — Other Ambulatory Visit: Payer: Self-pay | Admitting: Certified Nurse Midwife

## 2016-05-15 LAB — NUSWAB VG+, CANDIDA 6SP
CANDIDA ALBICANS, NAA: POSITIVE — AB
CANDIDA GLABRATA, NAA: NEGATIVE
CANDIDA LUSITANIAE, NAA: NEGATIVE
CANDIDA PARAPSILOSIS, NAA: NEGATIVE
Candida krusei, NAA: NEGATIVE
Candida tropicalis, NAA: NEGATIVE
Chlamydia trachomatis, NAA: NEGATIVE
Neisseria gonorrhoeae, NAA: NEGATIVE
Trich vag by NAA: NEGATIVE

## 2016-05-18 ENCOUNTER — Other Ambulatory Visit: Payer: Self-pay | Admitting: Certified Nurse Midwife

## 2016-05-27 ENCOUNTER — Other Ambulatory Visit: Payer: Medicaid Other

## 2016-05-28 ENCOUNTER — Encounter: Payer: Medicaid Other | Admitting: Certified Nurse Midwife

## 2016-05-28 ENCOUNTER — Other Ambulatory Visit: Payer: Medicaid Other

## 2016-06-04 ENCOUNTER — Ambulatory Visit (INDEPENDENT_AMBULATORY_CARE_PROVIDER_SITE_OTHER): Payer: Medicaid Other | Admitting: Obstetrics

## 2016-06-04 ENCOUNTER — Other Ambulatory Visit: Payer: Medicaid Other

## 2016-06-04 VITALS — BP 101/68 | HR 89 | Temp 98.5°F | Wt 177.2 lb

## 2016-06-04 DIAGNOSIS — Z3493 Encounter for supervision of normal pregnancy, unspecified, third trimester: Secondary | ICD-10-CM

## 2016-06-05 LAB — RPR: RPR Ser Ql: NONREACTIVE

## 2016-06-05 LAB — CBC WITH DIFFERENTIAL/PLATELET
BASOS: 0 %
Basophils Absolute: 0 10*3/uL (ref 0.0–0.2)
EOS (ABSOLUTE): 0.3 10*3/uL (ref 0.0–0.4)
Eos: 2 %
Hematocrit: 33.7 % — ABNORMAL LOW (ref 34.0–46.6)
Hemoglobin: 10.5 g/dL — ABNORMAL LOW (ref 11.1–15.9)
Immature Grans (Abs): 0.1 10*3/uL (ref 0.0–0.1)
Immature Granulocytes: 1 %
Lymphocytes Absolute: 2.8 10*3/uL (ref 0.7–3.1)
Lymphs: 24 %
MCH: 27.9 pg (ref 26.6–33.0)
MCHC: 31.2 g/dL — ABNORMAL LOW (ref 31.5–35.7)
MCV: 89 fL (ref 79–97)
MONOS ABS: 0.6 10*3/uL (ref 0.1–0.9)
Monocytes: 5 %
NEUTROS ABS: 7.8 10*3/uL — AB (ref 1.4–7.0)
NEUTROS PCT: 68 %
PLATELETS: 267 10*3/uL (ref 150–379)
RBC: 3.77 x10E6/uL (ref 3.77–5.28)
RDW: 15.3 % (ref 12.3–15.4)
WBC: 11.6 10*3/uL — ABNORMAL HIGH (ref 3.4–10.8)

## 2016-06-05 LAB — GLUCOSE TOLERANCE, 2 HOURS W/ 1HR
GLUCOSE, 1 HOUR: 118 mg/dL (ref 65–179)
GLUCOSE, 2 HOUR: 119 mg/dL (ref 65–152)
GLUCOSE, FASTING: 76 mg/dL (ref 65–91)

## 2016-06-05 LAB — HIV ANTIBODY (ROUTINE TESTING W REFLEX): HIV Screen 4th Generation wRfx: NONREACTIVE

## 2016-06-07 ENCOUNTER — Encounter: Payer: Self-pay | Admitting: Obstetrics

## 2016-06-07 NOTE — Progress Notes (Signed)
Patient ID: Anita Strickland, female   DOB: 12/03/1990, 25 y.o.   MRN: [id number] Subjective:    Anita Strickland is a 25 y.o. female being seen today for her obstetrical visit. She is at 2651w0d gestation. Patient reports no complaints. Fetal movement: normal.  Problem List Items Addressed This Visit    None    Visit Diagnoses    Prenatal care in third trimester    -  Primary   Relevant Orders   Glucose Tolerance, 2 Hours w/1 Hour (Completed)   CBC w/Diff (Completed)   RPR (Completed)   HIV antibody (Completed)     Patient Active Problem List   Diagnosis Date Noted  . Supervision of other normal pregnancy, antepartum 01/16/2016  . H/O gastric ulcer 10/14/2014  . Smoker 10/10/2014   Objective:    BP 101/68   Pulse 89   Temp 98.5 F (36.9 C)   Wt 177 lb 4 oz (80.4 kg)   LMP 11/02/2015 (Approximate)   BMI 33.49 kg/m  FHT:  150 BPM  Uterine Size: size equals dates  Presentation: unsure     Assessment:    Pregnancy @ 6351w0d weeks   Plan:     labs reviewed, problem list updated Consent signed. GBS sent TDAP offered  Rhogam given for RH negative Pediatrician: discussed. Infant feeding: plans to breastfeed. Maternity leave: discussed.  Orders Placed This Encounter  Procedures  . Glucose Tolerance, 2 Hours w/1 Hour  . CBC w/Diff  . RPR  . HIV antibody   No orders of the defined types were placed in this encounter.  Follow up in 2 Weeks.

## 2016-06-10 ENCOUNTER — Encounter (HOSPITAL_COMMUNITY): Payer: Self-pay | Admitting: *Deleted

## 2016-06-10 ENCOUNTER — Inpatient Hospital Stay (HOSPITAL_COMMUNITY)
Admission: AD | Admit: 2016-06-10 | Discharge: 2016-06-10 | Disposition: A | Discharge status: Home / Self Care | Payer: Medicaid Other | Source: Ambulatory Visit | Attending: Obstetrics & Gynecology | Admitting: Obstetrics & Gynecology

## 2016-06-10 DIAGNOSIS — O26893 Other specified pregnancy related conditions, third trimester: Secondary | ICD-10-CM | POA: Diagnosis not present

## 2016-06-10 DIAGNOSIS — Z3A30 30 weeks gestation of pregnancy: Secondary | ICD-10-CM | POA: Insufficient documentation

## 2016-06-10 DIAGNOSIS — O9989 Other specified diseases and conditions complicating pregnancy, childbirth and the puerperium: Secondary | ICD-10-CM | POA: Diagnosis not present

## 2016-06-10 DIAGNOSIS — R102 Pelvic and perineal pain: Secondary | ICD-10-CM | POA: Insufficient documentation

## 2016-06-10 DIAGNOSIS — F17210 Nicotine dependence, cigarettes, uncomplicated: Secondary | ICD-10-CM | POA: Insufficient documentation

## 2016-06-10 DIAGNOSIS — N949 Unspecified condition associated with female genital organs and menstrual cycle: Secondary | ICD-10-CM

## 2016-06-10 DIAGNOSIS — M25552 Pain in left hip: Secondary | ICD-10-CM | POA: Diagnosis not present

## 2016-06-10 DIAGNOSIS — O99333 Smoking (tobacco) complicating pregnancy, third trimester: Secondary | ICD-10-CM | POA: Diagnosis not present

## 2016-06-10 DIAGNOSIS — M5432 Sciatica, left side: Secondary | ICD-10-CM

## 2016-06-10 LAB — URINALYSIS, ROUTINE W REFLEX MICROSCOPIC
Bilirubin Urine: NEGATIVE
GLUCOSE, UA: NEGATIVE mg/dL
HGB URINE DIPSTICK: NEGATIVE
KETONES UR: NEGATIVE mg/dL
LEUKOCYTES UA: NEGATIVE
Nitrite: NEGATIVE
PROTEIN: NEGATIVE mg/dL
SPECIFIC GRAVITY, URINE: 1.02 (ref 1.005–1.030)
pH: 6 (ref 5.0–8.0)

## 2016-06-10 LAB — WET PREP, GENITAL
Clue Cells Wet Prep HPF POC: NONE SEEN
Sperm: NONE SEEN
Trich, Wet Prep: NONE SEEN
Yeast Wet Prep HPF POC: NONE SEEN

## 2016-06-10 MED ORDER — CYCLOBENZAPRINE HCL 10 MG PO TABS: 10.0000 mg | ORAL_TABLET | Freq: Every day | ORAL | 0 refills | Status: DC

## 2016-06-10 MED ORDER — CYCLOBENZAPRINE HCL 10 MG PO TABS: 10.0000 mg | ORAL_TABLET | Freq: Every day | ORAL | 0 refills | Status: AC

## 2016-06-10 MED ORDER — CYCLOBENZAPRINE HCL 10 MG PO TABS: 10.0000 mg | ORAL_TABLET | Freq: Once | ORAL | Status: DC

## 2016-06-10 MED ORDER — CYCLOBENZAPRINE HCL 10 MG PO TABS
10.0000 mg | ORAL_TABLET | Freq: Every day | ORAL | Status: DC
  Administered 2016-06-10: 10 mg via ORAL
  Filled 2016-06-10: qty 1

## 2016-06-10 NOTE — MAU Note (Signed)
Notified provider that patient is here.   

## 2016-06-10 NOTE — Discharge Instructions (Signed)
Follow up in clinic, keep all prenatal appointments.

## 2016-06-10 NOTE — MAU Note (Signed)
PT SAYS  SHE HAS  PAIN ON LEFT BUTTOCKS  RADIATING  DOWN  HER  LEG -     STARTED  AT 1 PM-   --  DIFFICULT  TO WALK.      ALSO  HAS  CRAMPING     IN PELVIS.     PNC-  WITH   FAMINA.    LAST SEX-     TODAY

## 2016-06-10 NOTE — MAU Provider Note (Signed)
History     CSN: [id number]  Arrival date and time: 06/10/16 2137      Chief Complaint  Patient presents with  . Abdominal Cramping  . Hip Pain   HPI [name] is a [id number] @ [redacted]w[redacted]d who presents with bilateral pelvic "cramping" and left hip pain. She reports that the pains are made worse with walking and are improved with rest. She describes the pain as sharp and shooting, it comes and goes. 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 She denies any vaginal discharge, bleeding, or urinary sx.    OB History    Gravida Para Term Preterm AB Living   0 1 1   SAB TAB Ectopic Multiple Live Births   0 1 0 0 1      Past Medical History:  Diagnosis Date  . Pregnancy induced hypertension     Past Surgical History:  Procedure Laterality Date  . ABCESS DRAINAGE     leg  . ESOPHAGOGASTRODUODENOSCOPY ENDOSCOPY      Family History  Problem Relation Age of Onset  . Hypertension Father   . Asthma Brother   . Diabetes Maternal Grandfather     Social History  Substance Use Topics  . Smoking status: Current Every Day Smoker    Packs/day: 0.10    Types: Cigarettes  . Smokeless tobacco: Never Used  . Alcohol use No    Allergies: No Known Allergies  Prescriptions Prior to Admission  Medication Sig Dispense Refill Last Dose  . albuterol (PROVENTIL HFA;VENTOLIN HFA) 108 (90 Base) MCG/ACT inhaler Inhale 1-2 puffs into the lungs every 6 (six) hours as needed for wheezing or shortness of breath.   Past Week at Unknown time  . Prenatal Vit-Fe Fumarate-FA (PRENATAL MULTIVITAMIN) TABS tablet Take 1 tablet by mouth daily at 12 noon.   06/09/2016 at Unknown time  . ranitidine (ZANTAC) 75 MG tablet Take 75 mg by mouth daily as needed for heartburn.   Past Week at Unknown time  . fluconazole (DIFLUCAN) 100 MG tablet Take 1 tablet (100 mg total) by mouth once. Repeat dose in 48-72 hour. (Patient not  taking: Reported on 06/10/2016) 3 tablet 0 Completed Course at Unknown time  . metroNIDAZOLE (FLAGYL) 500 MG tablet Take 1 tablet (500 mg total) by mouth 2 (two) times daily. (Patient not taking: Reported on 06/10/2016) 14 tablet 0 Completed Course at Unknown time  . terconazole (TERAZOL 3) 0.8 % vaginal cream Place 1 applicator vaginally at bedtime. (Patient not taking: Reported on 06/10/2016) 20 g 0 Completed Course at Unknown time    ROS: negative except for that mentioned in the HPI Physical Exam   Blood pressure 124/61, pulse 86, temperature 98.3 F (36.8 C), temperature source Oral, height  (1.549 m), weight 180 lb 4 oz (81.8 kg), last menstrual period 11/02/2015, not currently breastfeeding.  Physical Exam  Constitutional: She is oriented to person, place, and time. She appears well-developed and well-nourished.  Neck: Normal range of motion.  Cardiovascular: Normal rate, regular rhythm and normal heart sounds.   Respiratory: Effort normal and breath sounds normal.  GI: Soft. Bowel sounds are normal.  Musculoskeletal: She exhibits tenderness.  Tenderness bilaterally in low back and pelvis on exam  Neurological: She is alert and oriented to person, place, and time.  Skin: Skin is warm and dry.  Psychiatric: She has a normal mood and  affect. Her behavior is normal. Judgment and thought content normal.   Cervical exam: closed and posterio MAU Course  Procedures & Labs  Results for orders placed or performed during the hospital encounter of 06/10/16 (from the past 24 hour(s))  Urinalysis, Routine w reflex microscopic (not [facility])     Status: None   Collection Time: 06/10/16  9:52 PM  Result Value Ref Range   Color, Urine YELLOW YELLOW   APPearance CLEAR CLEAR   Specific Gravity, Urine 1.020 1.005 - 1.030   pH 6.0 5.0 - 8.0   Glucose, UA NEGATIVE NEGATIVE mg/dL   Hgb urine dipstick NEGATIVE NEGATIVE   Bilirubin Urine NEGATIVE NEGATIVE   Ketones, ur NEGATIVE NEGATIVE mg/dL    Protein, ur NEGATIVE NEGATIVE mg/dL   Nitrite NEGATIVE NEGATIVE   Leukocytes, UA NEGATIVE NEGATIVE  Wet prep, genital     Status: Abnormal   Collection Time: 06/10/16 11:03 PM  Result Value Ref Range   Yeast Wet Prep HPF POC NONE SEEN NONE SEEN   Trich, Wet Prep NONE SEEN NONE SEEN   Clue Cells Wet Prep HPF POC NONE SEEN NONE SEEN   WBC, Wet Prep HPF POC FEW (A) NONE SEEN   Sperm NONE SEEN      Assessment and Plan  Anita Strickland is a [id number] @ 5151w3d who presents with bilateral pelvic "cramping" and left hip pain. This is most likely MSK related e.g. Sciatic pain / round ligament pain. No indication of early labor and urinalysis negative. Patient reassured and discharged home with flexeril 10 mg at bedtime.   Anita Egericia Brein, MD, PGY-1, MPH 06/10/2016, 11:17 PM   OB FELLOW MAU ATTESTATION  I have seen and examined this patient; I agree with above documentation in the resident's note.    Anita Strickland 06/10/2016, 11:44 PM

## 2016-06-11 LAB — GC/CHLAMYDIA PROBE AMP (~~LOC~~) NOT AT ARMC
Chlamydia: NEGATIVE
Neisseria Gonorrhea: NEGATIVE

## 2016-06-17 ENCOUNTER — Other Ambulatory Visit: Payer: Self-pay | Admitting: *Deleted

## 2016-06-17 DIAGNOSIS — Z3483 Encounter for supervision of other normal pregnancy, third trimester: Secondary | ICD-10-CM

## 2016-06-17 MED ORDER — OB COMPLETE PETITE 35-5-1-200 MG PO CAPS: 1.0000 | ORAL_CAPSULE | Freq: Every day | ORAL | 11 refills | Status: DC

## 2016-06-17 NOTE — Progress Notes (Signed)
Change in PNV Rx due to [employer] on backorder.  OB Complete Petite sent in to pharmacy.

## 2016-06-18 ENCOUNTER — Encounter: Payer: Medicaid Other | Admitting: Certified Nurse Midwife

## 2016-06-28 ENCOUNTER — Telehealth: Payer: Self-pay | Admitting: *Deleted

## 2016-06-28 NOTE — Telephone Encounter (Signed)
Pt called in and stated she needed something called in for yeast. [facility] [address]. Please advise

## 2016-06-29 ENCOUNTER — Inpatient Hospital Stay (HOSPITAL_COMMUNITY)
Admission: AD | Admit: 2016-06-29 | Discharge: 2016-06-30 | Disposition: A | Discharge status: Home / Self Care | Payer: Medicaid Other | Source: Ambulatory Visit | Attending: Obstetrics and Gynecology | Admitting: Obstetrics and Gynecology

## 2016-06-29 DIAGNOSIS — Z79899 Other long term (current) drug therapy: Secondary | ICD-10-CM | POA: Insufficient documentation

## 2016-06-29 DIAGNOSIS — O368130 Decreased fetal movements, third trimester, not applicable or unspecified: Secondary | ICD-10-CM | POA: Insufficient documentation

## 2016-06-29 DIAGNOSIS — O368190 Decreased fetal movements, unspecified trimester, not applicable or unspecified: Secondary | ICD-10-CM

## 2016-06-29 DIAGNOSIS — R102 Pelvic and perineal pain: Secondary | ICD-10-CM | POA: Insufficient documentation

## 2016-06-29 DIAGNOSIS — O99333 Smoking (tobacco) complicating pregnancy, third trimester: Secondary | ICD-10-CM | POA: Insufficient documentation

## 2016-06-29 DIAGNOSIS — O26893 Other specified pregnancy related conditions, third trimester: Secondary | ICD-10-CM | POA: Insufficient documentation

## 2016-06-29 DIAGNOSIS — Z3A33 33 weeks gestation of pregnancy: Secondary | ICD-10-CM | POA: Insufficient documentation

## 2016-06-29 DIAGNOSIS — F17210 Nicotine dependence, cigarettes, uncomplicated: Secondary | ICD-10-CM | POA: Insufficient documentation

## 2016-06-30 ENCOUNTER — Encounter: Payer: Self-pay | Admitting: Certified Nurse Midwife

## 2016-06-30 ENCOUNTER — Inpatient Hospital Stay (HOSPITAL_COMMUNITY): Payer: Medicaid Other

## 2016-06-30 ENCOUNTER — Telehealth: Payer: Self-pay | Admitting: *Deleted

## 2016-06-30 DIAGNOSIS — O368130 Decreased fetal movements, third trimester, not applicable or unspecified: Secondary | ICD-10-CM | POA: Diagnosis present

## 2016-06-30 DIAGNOSIS — O99333 Smoking (tobacco) complicating pregnancy, third trimester: Secondary | ICD-10-CM | POA: Diagnosis not present

## 2016-06-30 DIAGNOSIS — Z79899 Other long term (current) drug therapy: Secondary | ICD-10-CM | POA: Diagnosis not present

## 2016-06-30 DIAGNOSIS — F17210 Nicotine dependence, cigarettes, uncomplicated: Secondary | ICD-10-CM | POA: Diagnosis not present

## 2016-06-30 DIAGNOSIS — Z3A33 33 weeks gestation of pregnancy: Secondary | ICD-10-CM | POA: Diagnosis not present

## 2016-06-30 DIAGNOSIS — R102 Pelvic and perineal pain: Secondary | ICD-10-CM | POA: Diagnosis not present

## 2016-06-30 DIAGNOSIS — O26893 Other specified pregnancy related conditions, third trimester: Secondary | ICD-10-CM | POA: Diagnosis not present

## 2016-06-30 LAB — URINALYSIS, ROUTINE W REFLEX MICROSCOPIC
BILIRUBIN URINE: NEGATIVE
GLUCOSE, UA: NEGATIVE mg/dL
HGB URINE DIPSTICK: NEGATIVE
Ketones, ur: NEGATIVE mg/dL
Leukocytes, UA: NEGATIVE
Nitrite: NEGATIVE
PROTEIN: NEGATIVE mg/dL
SPECIFIC GRAVITY, URINE: 1.015 (ref 1.005–1.030)
pH: 6.5 (ref 5.0–8.0)

## 2016-06-30 LAB — WET PREP, GENITAL
Clue Cells Wet Prep HPF POC: NONE SEEN
Sperm: NONE SEEN
Trich, Wet Prep: NONE SEEN
Yeast Wet Prep HPF POC: NONE SEEN

## 2016-06-30 NOTE — MAU Provider Note (Signed)
None     Chief Complaint:  Decreased Fetal Movement and Pelvic Pain   Mack HookBreanna Wooding is  25 y.o. G3P1011 at 6544w2d presents complaining of Decreased Fetal Movement and Pelvic Pain  On august 10th, patient was seen in MAU for pelvic pain. She states that she was prescribed flexeril for this pelvic pain. Flexeril helped with the pain. However, she noted that patient had decrease fetal movement. Patient has had about 1 week of decreased fetal movement and because of this decide to come in tonight. Patient denies any pain with urination, vaginal discharge, or other urinary symptoms. No nausea, vomiting, fevers or chills.   She states none contractions are associated with none vaginal bleeding, intact membranes, along with decreased  fetal movement.   Obstetrical/Gynecological History: OB History    Gravida Para Term Preterm AB Living   3 1 1  0 1 1   SAB TAB Ectopic Multiple Live Births   0 1 0 0 1     Past Medical History: Past Medical History:  Diagnosis Date  . Pregnancy induced hypertension     Past Surgical History: Past Surgical History:  Procedure Laterality Date  . ABCESS DRAINAGE     leg  . ESOPHAGOGASTRODUODENOSCOPY ENDOSCOPY      Family History: Family History  Problem Relation Age of Onset  . Hypertension Father   . Asthma Brother   . Diabetes Maternal Grandfather     Social History: Social History  Substance Use Topics  . Smoking status: Current Every Day Smoker    Packs/day: 0.10    Types: Cigarettes  . Smokeless tobacco: Never Used  . Alcohol use No    Allergies: No Known Allergies  Meds:  Prescriptions Prior to Admission  Medication Sig Dispense Refill Last Dose  . albuterol (PROVENTIL HFA;VENTOLIN HFA) 108 (90 Base) MCG/ACT inhaler Inhale 1-2 puffs into the lungs every 6 (six) hours as needed for wheezing or shortness of breath.   Past Week at Unknown time  . cyclobenzaprine (FLEXERIL) 10 MG tablet Take 1 tablet (10 mg total) by mouth at  bedtime. 20 tablet 0   . Prenat-FeCbn-FeAspGl-FA-Omega (OB COMPLETE PETITE) 35-5-1-200 MG CAPS Take 1 capsule by mouth daily. 30 capsule 11   . Prenatal Vit-Fe Fumarate-FA (PRENATAL MULTIVITAMIN) TABS tablet Take 1 tablet by mouth daily at 12 noon.   06/09/2016 at Unknown time  . ranitidine (ZANTAC) 75 MG tablet Take 75 mg by mouth daily as needed for heartburn.   Past Week at Unknown time    Review of Systems    Physical Exam  Blood pressure 122/66, pulse 94, temperature 98.3 F (36.8 C), temperature source Oral, resp. rate 18, last menstrual period 11/02/2015, not currently breastfeeding. GENERAL: Well-developed, well-nourished female in no acute distress.  ABDOMEN: Soft, nontender, nondistended, gravid.  EXTREMITIES: Nontender, no edema, 2+ distal pulses. FHT:  Baseline rate 130  bpm   Variability minimal  Accelerations absent   Decelerations none  Labs: No results found for this or any previous visit (from the past 24 hour(s)). Imaging Studies:  No results found.  Assessment: Mack HookBreanna Weatherbee is  25 y.o. G3P1011 at 7044w2d presenting for decrease fetal movement and 1 month hx of bilaterally pelvic pain.   Plan: Decreased Fetal Movement per patient  - FHT with minimal variability and non-reactive, therefore will get BPP - BPP 8/8, patient now reactive on FHT.- therefore 10/10     Pelvic pain previously well treated with Flexeril, no signs or symptoms concerning for infection. Therefore likely musculoskeletal.  -  Wet prep and UA are negative   Asiyah Z Mikell 8/30/20171:05 AM  OB FELLOW MAU ATTESTATION  I have seen and examined this patient; I agree with above documentation in the resident's note.    Ernestina Penna 06/30/2016, 4:30 AM

## 2016-06-30 NOTE — Telephone Encounter (Signed)
[facility] c[facility]o yeast infection [facility] [address] [phone].

## 2016-06-30 NOTE — Discharge Instructions (Signed)
Fetal Movement Counts  Patient Name: __________________________________________________ Patient Due Date: ____________________  Performing a fetal movement count is highly recommended in high-risk pregnancies, but it is good for every pregnant woman to do. Your health care provider may ask you to start counting fetal movements at 28 weeks of the pregnancy. Fetal movements often increase:  · After eating a full meal.  · After physical activity.  · After eating or drinking something sweet or cold.  · At rest.  Pay attention to when you feel the baby is most active. This will help you notice a pattern of your baby's sleep and wake cycles and what factors contribute to an increase in fetal movement. It is important to perform a fetal movement count at the same time each day when your baby is normally most active.   HOW TO COUNT FETAL MOVEMENTS  1. Find a quiet and comfortable area to sit or lie down on your left side. Lying on your left side provides the best blood and oxygen circulation to your baby.  2. Write down the day and time on a sheet of paper or in a journal.  3. Start counting kicks, flutters, swishes, rolls, or jabs in a 2-hour period. You should feel at least 10 movements within 2 hours.  4. If you do not feel 10 movements in 2 hours, wait 2-3 hours and count again. Look for a change in the pattern or not enough counts in 2 hours.  SEEK MEDICAL CARE IF:  · You feel less than 10 counts in 2 hours, tried twice.  · There is no movement in over an hour.  · The pattern is changing or taking longer each day to reach 10 counts in 2 hours.  · You feel the baby is not moving as he or she usually does.  Date: ____________ Movements: ____________ Start time: ____________ Finish time: ____________   Date: ____________ Movements: ____________ Start time: ____________ Finish time: ____________  Date: ____________ Movements: ____________ Start time: ____________ Finish time: ____________  Date: ____________ Movements:  ____________ Start time: ____________ Finish time: ____________  Date: ____________ Movements: ____________ Start time: ____________ Finish time: ____________  Date: ____________ Movements: ____________ Start time: ____________ Finish time: ____________  Date: ____________ Movements: ____________ Start time: ____________ Finish time: ____________  Date: ____________ Movements: ____________ Start time: ____________ Finish time: ____________   Date: ____________ Movements: ____________ Start time: ____________ Finish time: ____________  Date: ____________ Movements: ____________ Start time: ____________ Finish time: ____________  Date: ____________ Movements: ____________ Start time: ____________ Finish time: ____________  Date: ____________ Movements: ____________ Start time: ____________ Finish time: ____________  Date: ____________ Movements: ____________ Start time: ____________ Finish time: ____________  Date: ____________ Movements: ____________ Start time: ____________ Finish time: ____________  Date: ____________ Movements: ____________ Start time: ____________ Finish time: ____________   Date: ____________ Movements: ____________ Start time: ____________ Finish time: ____________  Date: ____________ Movements: ____________ Start time: ____________ Finish time: ____________  Date: ____________ Movements: ____________ Start time: ____________ Finish time: ____________  Date: ____________ Movements: ____________ Start time: ____________ Finish time: ____________  Date: ____________ Movements: ____________ Start time: ____________ Finish time: ____________  Date: ____________ Movements: ____________ Start time: ____________ Finish time: ____________  Date: ____________ Movements: ____________ Start time: ____________ Finish time: ____________   Date: ____________ Movements: ____________ Start time: ____________ Finish time: ____________  Date: ____________ Movements: ____________ Start time: ____________ Finish  time: ____________  Date: ____________ Movements: ____________ Start time: ____________ Finish time: ____________  Date: ____________ Movements: ____________ Start time:   ____________ Finish time: ____________  Date: ____________ Movements: ____________ Start time: ____________ Finish time: ____________  Date: ____________ Movements: ____________ Start time: ____________ Finish time: ____________  Date: ____________ Movements: ____________ Start time: ____________ Finish time: ____________   Date: ____________ Movements: ____________ Start time: ____________ Finish time: ____________  Date: ____________ Movements: ____________ Start time: ____________ Finish time: ____________  Date: ____________ Movements: ____________ Start time: ____________ Finish time: ____________  Date: ____________ Movements: ____________ Start time: ____________ Finish time: ____________  Date: ____________ Movements: ____________ Start time: ____________ Finish time: ____________  Date: ____________ Movements: ____________ Start time: ____________ Finish time: ____________  Date: ____________ Movements: ____________ Start time: ____________ Finish time: ____________   Date: ____________ Movements: ____________ Start time: ____________ Finish time: ____________  Date: ____________ Movements: ____________ Start time: ____________ Finish time: ____________  Date: ____________ Movements: ____________ Start time: ____________ Finish time: ____________  Date: ____________ Movements: ____________ Start time: ____________ Finish time: ____________  Date: ____________ Movements: ____________ Start time: ____________ Finish time: ____________  Date: ____________ Movements: ____________ Start time: ____________ Finish time: ____________  Date: ____________ Movements: ____________ Start time: ____________ Finish time: ____________   Date: ____________ Movements: ____________ Start time: ____________ Finish time: ____________  Date: ____________  Movements: ____________ Start time: ____________ Finish time: ____________  Date: ____________ Movements: ____________ Start time: ____________ Finish time: ____________  Date: ____________ Movements: ____________ Start time: ____________ Finish time: ____________  Date: ____________ Movements: ____________ Start time: ____________ Finish time: ____________  Date: ____________ Movements: ____________ Start time: ____________ Finish time: ____________  Date: ____________ Movements: ____________ Start time: ____________ Finish time: ____________   Date: ____________ Movements: ____________ Start time: ____________ Finish time: ____________  Date: ____________ Movements: ____________ Start time: ____________ Finish time: ____________  Date: ____________ Movements: ____________ Start time: ____________ Finish time: ____________  Date: ____________ Movements: ____________ Start time: ____________ Finish time: ____________  Date: ____________ Movements: ____________ Start time: ____________ Finish time: ____________  Date: ____________ Movements: ____________ Start time: ____________ Finish time: ____________     This information is not intended to replace advice given to you by your health care provider. Make sure you discuss any questions you have with your health care provider.     Document Released: 11/17/2006 Document Revised: 11/08/2014 Document Reviewed: 08/14/2012  Elsevier Interactive Patient Education ©2016 [employer]

## 2016-06-30 NOTE — MAU Note (Signed)
Pt presents complaining of increased pelvic pain and decreased fetal movement. States she has flexiril at home but was afraid that was making the baby move less. Denies leaking or bleeding. Reports good fetal movement.

## 2016-07-05 ENCOUNTER — Encounter (HOSPITAL_COMMUNITY): Payer: Self-pay | Admitting: *Deleted

## 2016-07-05 ENCOUNTER — Inpatient Hospital Stay (HOSPITAL_COMMUNITY)
Admission: AD | Admit: 2016-07-05 | Discharge: 2016-07-05 | Disposition: A | Discharge status: Home / Self Care | Payer: Medicaid Other | Source: Ambulatory Visit | Attending: Obstetrics & Gynecology | Admitting: Obstetrics & Gynecology

## 2016-07-05 DIAGNOSIS — F17210 Nicotine dependence, cigarettes, uncomplicated: Secondary | ICD-10-CM | POA: Insufficient documentation

## 2016-07-05 DIAGNOSIS — O479 False labor, unspecified: Secondary | ICD-10-CM

## 2016-07-05 DIAGNOSIS — O4703 False labor before 37 completed weeks of gestation, third trimester: Secondary | ICD-10-CM | POA: Insufficient documentation

## 2016-07-05 DIAGNOSIS — O99333 Smoking (tobacco) complicating pregnancy, third trimester: Secondary | ICD-10-CM | POA: Diagnosis not present

## 2016-07-05 DIAGNOSIS — Z3A34 34 weeks gestation of pregnancy: Secondary | ICD-10-CM | POA: Insufficient documentation

## 2016-07-05 LAB — WET PREP, GENITAL
CLUE CELLS WET PREP: NONE SEEN
Sperm: NONE SEEN
TRICH WET PREP: NONE SEEN
Yeast Wet Prep HPF POC: NONE SEEN

## 2016-07-05 LAB — URINE MICROSCOPIC-ADD ON

## 2016-07-05 LAB — URINALYSIS, ROUTINE W REFLEX MICROSCOPIC
Bilirubin Urine: NEGATIVE
Glucose, UA: NEGATIVE mg/dL
HGB URINE DIPSTICK: NEGATIVE
Ketones, ur: 15 mg/dL — AB
Leukocytes, UA: NEGATIVE
Nitrite: NEGATIVE
Protein, ur: 30 mg/dL — AB
SPECIFIC GRAVITY, URINE: 1.025 (ref 1.005–1.030)
pH: 6.5 (ref 5.0–8.0)

## 2016-07-05 MED ORDER — OXYCODONE-ACETAMINOPHEN 5-325 MG PO TABS: 1.0000 | ORAL_TABLET | Freq: Once | ORAL | Status: DC

## 2016-07-05 NOTE — MAU Note (Signed)
Patient presents to mau with c/o contractions that started around 8pm. Denies LOF, VB. +FM

## 2016-07-05 NOTE — MAU Note (Signed)
Urine sent to lab 

## 2016-07-05 NOTE — MAU Provider Note (Signed)
Alv [id number] PCicero Duckw1dWaJair ckw1dklJair G>f Fort WorthLynelle DoctorHarless [phone] FerrariRedbirdCrissie Sickles thor CaptainDorothey Basemanrendolyn PattyRana [phone] [phone] Fus[address]  anassas ParkAlvy BimlerAugust Saucer33Katina Degree35 icero Duck42w1dJairo BenWilmon [id number] Surgical [facility] LLCLynelle DoctorHarless [phone] FerrariPalm ShoresCrissie Sickles Arthor CaptainDorothey BasemanBrendolyn PattyRana [phone] [phone] [id number]  Pounding MillAlvy BimlerAugust Saucer76Katina Degree77 Cicero Duck79w1dJairo BenWilmon [id number] LPLynelle DoctorHarless [phone] FerrariScottsbluffCrissie Sickles Arthor CaptainDorothey BasemanBrendolyn PattyRana [phone] [phone]  Little BrowningAlvy BimlerAugust Saucer36Katina Degree2 Cicero [id number] BenWilmon [id number] CampusLynelle DoctorHarless [phone] FerrariHarrisburgCrissie Sickles Arthor CaptainDorothey BasemanBrendolyn PattyRana Snare  squamous cells Wet mount negative  Assessment and Plan  IUP@34 .0 Braxton hicks contractions - encouraged good po hydration - given return precautions and labor precautions  Leland HerElsia J Yoo PGY-1 07/05/2016, 3:04 AM

## 2016-07-05 NOTE — Discharge Instructions (Signed)
Braxton Hicks Contractions °Contractions of the uterus can occur throughout pregnancy. Contractions are not always a sign that you are in labor.  °WHAT ARE BRAXTON HICKS CONTRACTIONS?  °Contractions that occur before labor are called Braxton Hicks contractions, or false labor. Toward the end of pregnancy (32-34 weeks), these contractions can develop more often and may become more forceful. This is not true labor because these contractions do not result in opening (dilatation) and thinning of the cervix. They are sometimes difficult to tell apart from true labor because these contractions can be forceful and people have different pain tolerances. You should not feel embarrassed if you go to the hospital with false labor. Sometimes, the only way to tell if you are in true labor is for your health care provider to look for changes in the cervix. °If there are no prenatal problems or other health problems associated with the pregnancy, it is completely safe to be sent home with false labor and await the onset of true labor. °HOW CAN YOU TELL THE DIFFERENCE BETWEEN TRUE AND FALSE LABOR? °False Labor °· The contractions of false labor are usually shorter and not as hard as those of true labor.   °· The contractions are usually irregular.   °· The contractions are often felt in the front of the lower abdomen and in the groin.   °· The contractions may go away when you walk around or change positions while lying down.   °· The contractions get weaker and are shorter lasting as time goes on.   °· The contractions do not usually become progressively stronger, regular, and closer together as with true labor.   °True Labor °· Contractions in true labor last 30-70 seconds, become very regular, usually become more intense, and increase in frequency.   °· The contractions do not go away with walking.   °· The discomfort is usually felt in the top of the uterus and spreads to the lower abdomen and low back.   °· True labor can be  determined by your health care provider with an exam. This will show that the cervix is dilating and getting thinner.   °WHAT TO REMEMBER °· Keep up with your usual exercises and follow other instructions given by your health care provider.   °· Take medicines as directed by your health care provider.   °· Keep your regular prenatal appointments.   °· Eat and drink lightly if you think you are going into labor.   °· If Braxton Hicks contractions are making you uncomfortable:   °¨ Change your position from lying down or resting to walking, or from walking to resting.   °¨ Sit and rest in a tub of warm water.   °¨ Drink 2-3 glasses of water. Dehydration may cause these contractions.   °¨ Do slow and deep breathing several times an hour.   °WHEN SHOULD I SEEK IMMEDIATE MEDICAL CARE? °Seek immediate medical care if: °· Your contractions become stronger, more regular, and closer together.   °· You have fluid leaking or gushing from your vagina.   °· You have a fever.   °· You pass blood-tinged mucus.   °· You have vaginal bleeding.   °· You have continuous abdominal pain.   °· You have low back pain that you never had before.   °· You feel your baby's head pushing down and causing pelvic pressure.   °· Your baby is not moving as much as it used to.   °  °This information is not intended to replace advice given to you by your health care provider. Make sure you discuss any questions you have with your health care   provider. °  °Document Released: 10/18/2005 Document Revised: 10/23/2013 Document Reviewed: 07/30/2013 °Elsevier Interactive Patient Education ©2016 [employer] Inc. ° °

## 2016-07-05 NOTE — MAU Note (Signed)
Patient declines pain medication at this time.

## 2016-07-08 ENCOUNTER — Ambulatory Visit (INDEPENDENT_AMBULATORY_CARE_PROVIDER_SITE_OTHER): Payer: Medicaid Other | Admitting: Advanced Practice Midwife

## 2016-07-08 VITALS — BP 125/77 | HR 91 | Temp 98.7°F | Wt 180.4 lb

## 2016-07-08 DIAGNOSIS — B3731 Acute candidiasis of vulva and vagina: Secondary | ICD-10-CM

## 2016-07-08 DIAGNOSIS — B373 Candidiasis of vulva and vagina: Secondary | ICD-10-CM

## 2016-07-08 MED ORDER — TERCONAZOLE 0.4 % VA CREA: 1.0000 | TOPICAL_CREAM | Freq: Every day | VAGINAL | 1 refills | Status: AC

## 2016-07-08 NOTE — Patient Instructions (Signed)

## 2016-07-08 NOTE — Progress Notes (Signed)
   PRENATAL VISIT NOTE  Subjective:  Anita Strickland is a 25 y.o. G3P1011 at 2724w3d being seen today for ongoing prenatal care.  She is currently monitored for the following issues for this low-risk pregnancy and has Smoker; H/O gastric ulcer; and Supervision of other normal pregnancy, antepartum on her problem list.  Patient reports vaginal itching and white discharge x 2 weeks and occasional pelvic pressure.  Contractions: Not present. Vag. Bleeding: None.  Movement: Present. Denies leaking of fluid.   The following portions of the patient's history were reviewed and updated as appropriate: allergies, current medications, past family history, past medical history, past social history, past surgical history and problem list. Problem list updated.  Objective:   Vitals:   07/08/16 1315  BP: 125/77  Pulse: 91  Temp: 98.7 F (37.1 C)  Weight: 180 lb 6.4 oz (81.8 kg)    Fetal Status:     Movement: Present     General:  Alert, oriented and cooperative. Patient is in no acute distress.  Skin: Skin is warm and dry. No rash noted.   Cardiovascular: Normal heart rate noted  Respiratory: Normal respiratory effort, no problems with respiration noted  Abdomen: Soft, gravid, appropriate for gestational age. Pain/Pressure: Present     Pelvic:  Cervical exam deferred        Extremities: Normal range of motion.  Edema: None  Mental Status: Normal mood and affect. Normal behavior. Normal judgment and thought content.   Urinalysis: Urine Protein: Negative Urine Glucose: Negative  Assessment and Plan:  Pregnancy: G3P1011 at 6424w3d  1. Vaginal yeast infection --Pt had wet prep x 2 in last month in MAU for similar symptoms but test negative so no treatment. Will treat today for yeast, f/u with pt in 2 weeks for improvement. - terconazole (TERAZOL 7) 0.4 % vaginal cream; Place 1 applicator vaginally at bedtime.  Dispense: 45 g; Refill: 1  Preterm labor symptoms and general obstetric precautions  including but not limited to vaginal bleeding, contractions, leaking of fluid and fetal movement were reviewed in detail with the patient. Please refer to After Visit Summary for other counseling recommendations.  Return in about 2 weeks (around 07/22/2016).  Hurshel PartyLisa A Leftwich-Kirby, CNM

## 2016-07-08 NOTE — Progress Notes (Signed)
Pt c/o white/milky discharge and vaginal itching x2 weeks.  She c/o pelvic pressure also.

## 2016-07-23 ENCOUNTER — Encounter: Payer: Self-pay | Admitting: Obstetrics

## 2016-07-23 ENCOUNTER — Ambulatory Visit (INDEPENDENT_AMBULATORY_CARE_PROVIDER_SITE_OTHER): Payer: Medicaid Other | Admitting: Obstetrics

## 2016-07-23 VITALS — BP 114/73 | HR 98 | Temp 98.6°F | Wt 185.1 lb

## 2016-07-23 DIAGNOSIS — Z3493 Encounter for supervision of normal pregnancy, unspecified, third trimester: Secondary | ICD-10-CM

## 2016-07-23 NOTE — Progress Notes (Signed)
Subjective:    Anita Strickland is a 25 y.o. female being seen today for her obstetrical visit. She is at 5258w4d gestation. Patient reports no complaints. Fetal movement: normal.  Problem List Items Addressed This Visit    None    Visit Diagnoses   None.    Patient Active Problem List   Diagnosis Date Noted  . Supervision of other normal pregnancy, antepartum 01/16/2016  . H/O gastric ulcer 10/14/2014  . Smoker 10/10/2014    Objective:    BP 114/73   Pulse 98   Temp 98.6 F (37 C)   Wt 185 lb 1.6 oz (84 kg)   LMP 11/02/2015 (Approximate)   BMI 34.97 kg/m  FHT: 150 BPM  Uterine Size: size equals dates    Assessment:    Pregnancy @ 4758w4d weeks   Plan:   Plans for delivery: Vaginal anticipated; labs reviewed; problem list updated Counseling: Consent signed. Infant feeding: plans to breastfeed. Cigarette smoking: never smoked. L&D discussion: symptoms of labor, discussed when to call, discussed what number to call, anesthetic/analgesic options reviewed and delivering clinician:  plans no preference. Postpartum supports and preparation: circumcision discussed and contraception plans discussed.  Follow up in 1 Week.

## 2016-07-23 NOTE — Progress Notes (Signed)
Patient have pelvic and back pain and sore on back of right leg x 1 week.

## 2016-07-29 ENCOUNTER — Ambulatory Visit (INDEPENDENT_AMBULATORY_CARE_PROVIDER_SITE_OTHER): Payer: Medicaid Other | Admitting: Certified Nurse Midwife

## 2016-07-29 DIAGNOSIS — Z23 Encounter for immunization: Secondary | ICD-10-CM

## 2016-07-29 DIAGNOSIS — Z3483 Encounter for supervision of other normal pregnancy, third trimester: Secondary | ICD-10-CM

## 2016-07-29 NOTE — Patient Instructions (Signed)
Pregnancy and Sex Your sex life may change during pregnancy as well as after your newborn arrives. It is normal to have questions about sex during pregnancy. All women are affected differently by pregnancy hormones. You may notice an increase or decrease in your sexual drive throughout your pregnancy. Also, your partner's attitude and sexual drive may change. Share the information in this document with your partner. Talk openly about how you feel about sex. WHEN IS IT SAFE TO HAVE SEXUAL INTERCOURSE DURING PREGNANCY?  Sexual intercourse is generally considered safe throughout a normal low-risk pregnancy. Remember:  The fetus is protected by the uterus and the fluid-filled sac that surrounds the fetus (amniotic sac).  The cervix is closed or sealed during pregnancy.  The penis does not reach or harm the fetus during sex.  Sex and orgasms are not thought to cause miscarriages or early labor.  If you use lubricants, use a water-soluble product. WHAT RISK FACTORS MAKE IT UNSAFE TO HAVE SEX WHILE PREGNANT? Some complications or risk factors may make it necessary to limit sexual activity, such as:  You have a history of miscarriage or preterm labor.  You have bleeding, discharge, fluid leakage, or contractions.  Your placenta may be partially covering or completely covering the opening to the cervix (placenta previa).  Your cervix is weak and opens easily (incompetent cervix).  Your partner has a sexually transmitted disease (STD). Avoid sex with the infected person or use a condom to prevent infection to the fetus.  You are unsure of your partner's sexual history. Avoid sex or use condoms.  You are having twins, triples, or other multiples. Your health care provider will help you determine whether sex during your pregnancy is safe or not. WHAT PRACTICES ARE UNSAFE?  If you engage in oral sex, you should avoid having your partner blow air into your vagina. This can send a dangerous air  bubble into your bloodstream.  Anal sex is not recommended during pregnancy. It can spread bacteria from the rectum and aggravate hemorrhoids.   This information is not intended to replace advice given to you by your health care provider. Make sure you discuss any questions you have with your health care provider.   Document Released: 04/07/2010 Document Revised: 11/08/2014 Document Reviewed: 05/17/2013 Elsevier Interactive Patient Education [employer] Third Trimester of Pregnancy The third trimester is from week 29 through week 42, months 7 through 9. The third trimester is a time when the fetus is growing rapidly. At the end of the ninth month, the fetus is about 20 inches in length and weighs 6-10 pounds.  BODY CHANGES Your body goes through many changes during pregnancy. The changes vary from woman to woman.   Your weight will continue to increase. You can expect to gain 25-35 pounds (11-16 kg) by the end of the pregnancy.  You may begin to get stretch marks on your hips, abdomen, and breasts.  You may urinate more often because the fetus is moving lower into your pelvis and pressing on your bladder.  You may develop or continue to have heartburn as a result of your pregnancy.  You may develop constipation because certain hormones are causing the muscles that push waste through your intestines to slow down.  You may develop hemorrhoids or swollen, bulging veins (varicose veins).  You may have pelvic pain because of the weight gain and pregnancy hormones relaxing your joints between the bones in your pelvis. Backaches may result from overexertion of the muscles supporting your  posture.  You may have changes in your hair. These can include thickening of your hair, rapid growth, and changes in texture. Some women also have hair loss during or after pregnancy, or hair that feels dry or thin. Your hair will most likely return to normal after your baby is born.  Your breasts will  continue to grow and be tender. A yellow discharge may leak from your breasts called colostrum.  Your belly button may stick out.  You may feel short of breath because of your expanding uterus.  You may notice the fetus "dropping," or moving lower in your abdomen.  You may have a bloody mucus discharge. This usually occurs a few days to a week before labor begins.  Your cervix becomes thin and soft (effaced) near your due date. WHAT TO EXPECT AT YOUR PRENATAL EXAMS  You will have prenatal exams every 2 weeks until week 36. Then, you will have weekly prenatal exams. During a routine prenatal visit:  You will be weighed to make sure you and the fetus are growing normally.  Your blood pressure is taken.  Your abdomen will be measured to track your baby's growth.  The fetal heartbeat will be listened to.  Any test results from the previous visit will be discussed.  You may have a cervical check near your due date to see if you have effaced. At around 36 weeks, your caregiver will check your cervix. At the same time, your caregiver will also perform a test on the secretions of the vaginal tissue. This test is to determine if a type of bacteria, Group B streptococcus, is present. Your caregiver will explain this further. Your caregiver may ask you:  What your birth plan is.  How you are feeling.  If you are feeling the baby move.  If you have had any abnormal symptoms, such as leaking fluid, bleeding, severe headaches, or abdominal cramping.  If you are using any tobacco products, including cigarettes, chewing tobacco, and electronic cigarettes.  If you have any questions. Other tests or screenings that may be performed during your third trimester include:  Blood tests that check for low iron levels (anemia).  Fetal testing to check the health, activity level, and growth of the fetus. Testing is done if you have certain medical conditions or if there are problems during the  pregnancy.  HIV (human immunodeficiency virus) testing. If you are at high risk, you may be screened for HIV during your third trimester of pregnancy. FALSE LABOR You may feel small, irregular contractions that eventually go away. These are called Braxton Hicks contractions, or false labor. Contractions may last for hours, days, or even weeks before true labor sets in. If contractions come at regular intervals, intensify, or become painful, it is best to be seen by your caregiver.  SIGNS OF LABOR   Menstrual-like cramps.  Contractions that are 5 minutes apart or less.  Contractions that start on the top of the uterus and spread down to the lower abdomen and back.  A sense of increased pelvic pressure or back pain.  A watery or bloody mucus discharge that comes from the vagina. If you have any of these signs before the 37th week of pregnancy, call your caregiver right away. You need to go to the hospital to get checked immediately. HOME CARE INSTRUCTIONS   Avoid all smoking, herbs, alcohol, and unprescribed drugs. These chemicals affect the formation and growth of the baby.  Do not use any tobacco products, including cigarettes,  chewing tobacco, and electronic cigarettes. If you need help quitting, ask your health care provider. You may receive counseling support and other resources to help you quit.  Follow your caregiver's instructions regarding medicine use. There are medicines that are either safe or unsafe to take during pregnancy.  Exercise only as directed by your caregiver. Experiencing uterine cramps is a good sign to stop exercising.  Continue to eat regular, healthy meals.  Wear a good support bra for breast tenderness.  Do not use hot tubs, steam rooms, or saunas.  Wear your seat belt at all times when driving.  Avoid raw meat, uncooked cheese, cat litter boxes, and soil used by cats. These carry germs that can cause birth defects in the baby.  Take your prenatal  vitamins.  Take 1500-2000 mg of calcium daily starting at the 20th week of pregnancy until you deliver your baby.  Try taking a stool softener (if your caregiver approves) if you develop constipation. Eat more high-fiber foods, such as fresh vegetables or fruit and whole grains. Drink plenty of fluids to keep your urine clear or pale yellow.  Take warm sitz baths to soothe any pain or discomfort caused by hemorrhoids. Use hemorrhoid cream if your caregiver approves.  If you develop varicose veins, wear support hose. Elevate your feet for 15 minutes, 3-4 times a day. Limit salt in your diet.  Avoid heavy lifting, wear low heal shoes, and practice good posture.  Rest a lot with your legs elevated if you have leg cramps or low back pain.  Visit your dentist if you have not gone during your pregnancy. Use a soft toothbrush to brush your teeth and be gentle when you floss.  A sexual relationship may be continued unless your caregiver directs you otherwise.  Do not travel far distances unless it is absolutely necessary and only with the approval of your caregiver.  Take prenatal classes to understand, practice, and ask questions about the labor and delivery.  Make a trial run to the hospital.  Pack your hospital bag.  Prepare the baby's nursery.  Continue to go to all your prenatal visits as directed by your caregiver. SEEK MEDICAL CARE IF:  You are unsure if you are in labor or if your water has broken.  You have dizziness.  You have mild pelvic cramps, pelvic pressure, or nagging pain in your abdominal area.  You have persistent nausea, vomiting, or diarrhea.  You have a bad smelling vaginal discharge.  You have pain with urination. SEEK IMMEDIATE MEDICAL CARE IF:   You have a fever.  You are leaking fluid from your vagina.  You have spotting or bleeding from your vagina.  You have severe abdominal cramping or pain.  You have rapid weight loss or gain.  You have  shortness of breath with chest pain.  You notice sudden or extreme swelling of your face, hands, ankles, feet, or legs.  You have not felt your baby move in over an hour.  You have severe headaches that do not go away with medicine.  You have vision changes.   This information is not intended to replace advice given to you by your health care provider. Make sure you discuss any questions you have with your health care provider.   Document Released: 10/12/2001 Document Revised: 11/08/2014 Document Reviewed: 12/19/2012 Elsevier Interactive Patient Education 2016 [employer].  Before Baby Comes Home Before your baby arrives it is important to:  Have all of the supplies that you will need  to care for your baby.  Know where to go if there is an emergency.  Discuss the baby's arrival with other family members. WHAT SUPPLIES WILL I NEED? It is recommended that you have the following supplies: Large Items  Crib.  Crib mattress.  Rear-facing infant car seat. If possible, have a trained professional check to make sure that it is installed correctly. Feeding  6-8 bottles that are 4-5 oz in size.  6-8 nipples.  Bottle brush.  Sterilizer, or a large pan or kettle with a lid.  A way to boil and cool water.  If you will be breastfeeding:  Breast pump.  Nipple cream.  Nursing bra.  Breast pads.  Breast shields.  If you will be formula feeding:  Formula.  Measuring cups.  Measuring spoons. Bathing  Mild baby soap and baby shampoo.  Petroleum jelly.  Soft cloth towel and washcloth.  Hooded towel.  Cotton balls.  Bath basin. Other Supplies  Rectal thermometer.  Bulb syringe.  Baby wipes or washcloths for diaper changes.  Diaper bag.  Changing pad.  Clothing, including one-piece outfits and pajamas.  Baby nail clippers.  Receiving blankets.  Mattress pad and sheets for the crib.  Night-light for the baby's room.  Baby monitor.  2 or 3  pacifiers.  Either 24-36 cloth diapers and waterproof diaper covers or a box of disposable diapers. You may need to use as many as 10-12 diapers per day. HOW DO I PREPARE FOR AN EMERGENCY? Prepare for an emergency by:  Knowing how to get to the nearest hospital.  Listing the phone numbers of your baby's health care providers near your home phone and in your cell phone. HOW DO I PREPARE MY FAMILY?  Decide how to handle visitors.  If you have other children:  Talk with them about the baby coming home. Ask them how they feel about it.  Read a book together about being a new big brother or sister.  Find ways to let them help you prepare for the new baby.  Have someone ready to care for them while you are in the hospital.   This information is not intended to replace advice given to you by your health care provider. Make sure you discuss any questions you have with your health care provider.   Document Released: 09/30/2008 Document Revised: 03/04/2015 Document Reviewed: 09/25/2014 Elsevier Interactive Patient Education [employer] Inc[employer]

## 2016-07-29 NOTE — Progress Notes (Signed)
Subjective:    Mack HookBreanna Elders is a 25 y.o. female being seen today for her obstetrical visit. She is at 6572w3d gestation. Patient reports backache, no bleeding, no contractions, no cramping and no leaking. Fetal movement: normal.  Problem List Items Addressed This Visit      Other   Supervision of other normal pregnancy, antepartum   Relevant Orders   Flu Vaccine QUAD 36+ mos IM (Fluarix, Quad PF) (Completed)   Strep Gp B NAA   NuSwab VG+, Candida 6sp    Other Visit Diagnoses   None.    Patient Active Problem List   Diagnosis Date Noted  . Supervision of other normal pregnancy, antepartum 01/16/2016  . H/O gastric ulcer 10/14/2014  . Smoker 10/10/2014    Objective:    BP 112/73   Pulse 80   Wt 185 lb 8 oz (84.1 kg)   LMP 11/02/2015 (Approximate)   BMI 35.05 kg/m  FHT: 132 BPM  Uterine Size: 37 cm and size equals dates  Presentations: cephalic  Pelvic Exam:              Dilation: 1cm       Effacement: Long             Station:  -3    Consistency: medium            Position: posterior     Assessment:    Pregnancy @ 272w3d weeks   doing well  Plan:   Plans for delivery: Vaginal anticipated; labs reviewed; problem list updated Counseling: Consent signed. Infant feeding: plans to breastfeed. Cigarette smoking: never smoked. L&D discussion: symptoms of labor, discussed when to call, discussed what number to call, anesthetic/analgesic options reviewed and delivering clinician:  plans Certified Nurse-Midwife. Postpartum supports and preparation: circumcision discussed and contraception plans discussed. Patient is contemplating stopiong work this next week.   Follow up in 1 Week.

## 2016-07-31 LAB — STREP GP B NAA: Strep Gp B NAA: POSITIVE — AB

## 2016-08-03 ENCOUNTER — Other Ambulatory Visit: Payer: Self-pay | Admitting: Certified Nurse Midwife

## 2016-08-04 LAB — NUSWAB VG+, CANDIDA 6SP
CANDIDA KRUSEI, NAA: NEGATIVE
CANDIDA PARAPSILOSIS, NAA: NEGATIVE
CANDIDA TROPICALIS, NAA: NEGATIVE
Candida albicans, NAA: NEGATIVE
Candida glabrata, NAA: NEGATIVE
Candida lusitaniae, NAA: NEGATIVE
Chlamydia trachomatis, NAA: NEGATIVE
Neisseria gonorrhoeae, NAA: NEGATIVE
TRICH VAG BY NAA: NEGATIVE

## 2016-08-05 ENCOUNTER — Encounter: Payer: Self-pay | Admitting: Certified Nurse Midwife

## 2016-08-05 ENCOUNTER — Encounter: Payer: Medicaid Other | Admitting: Obstetrics

## 2016-08-19 ENCOUNTER — Encounter: Payer: Medicaid Other | Admitting: Obstetrics

## 2016-10-11 ENCOUNTER — Encounter (HOSPITAL_COMMUNITY): Payer: Self-pay

## 2017-03-22 ENCOUNTER — Other Ambulatory Visit: Payer: Self-pay | Admitting: Certified Nurse Midwife

## 2017-06-03 IMAGING — US US OB TRANSVAGINAL
1 series · 15 of 28 positions shown · non-contrast
Comparison: None.

CLINICAL DATA: Pelvic pain. Beta HCG 418. Unknown last menstrual
period.

EXAM:
OBSTETRIC <14 WK US AND TRANSVAGINAL OB US
TECHNIQUE: Both transabdominal and transvaginal ultrasound examinations were
performed for complete evaluation of the gestation as well as the
maternal uterus, adnexal regions, and pelvic cul-de-sac.
Transvaginal technique was performed to assess early pregnancy.

[Series 1: us ob transvaginal · 15 of 39 slices shown]
[im 1/39]
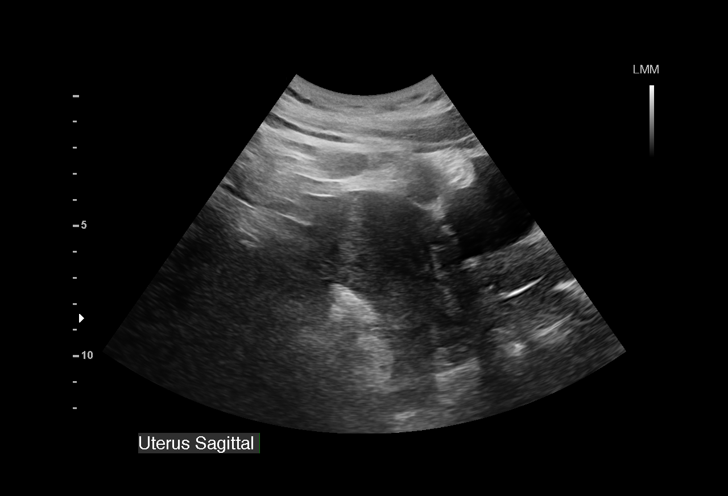
[im 3/39]
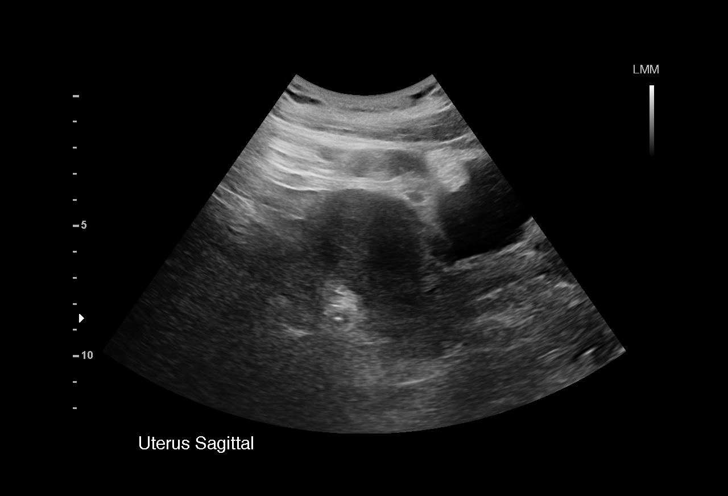
[im 6/39]
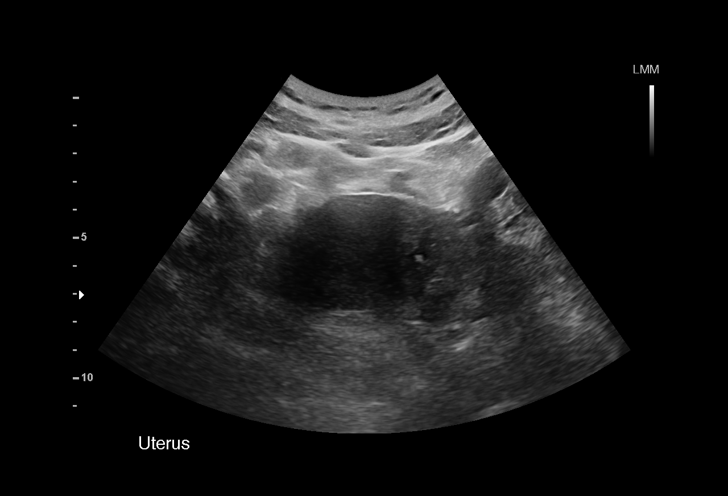
[im 9/39]
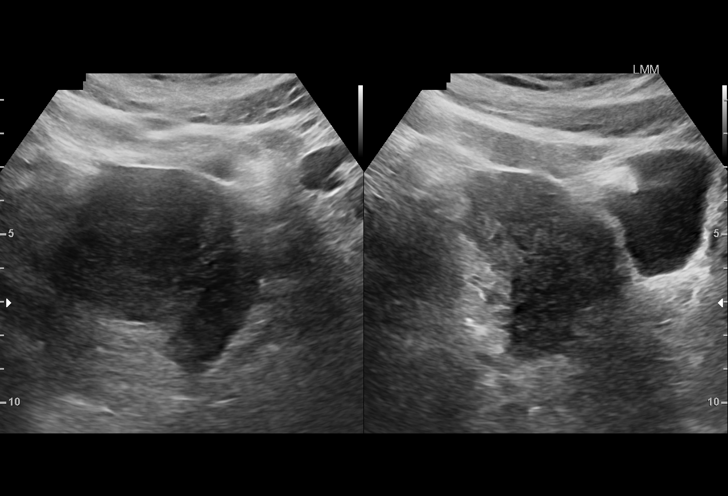
[im 12/39]
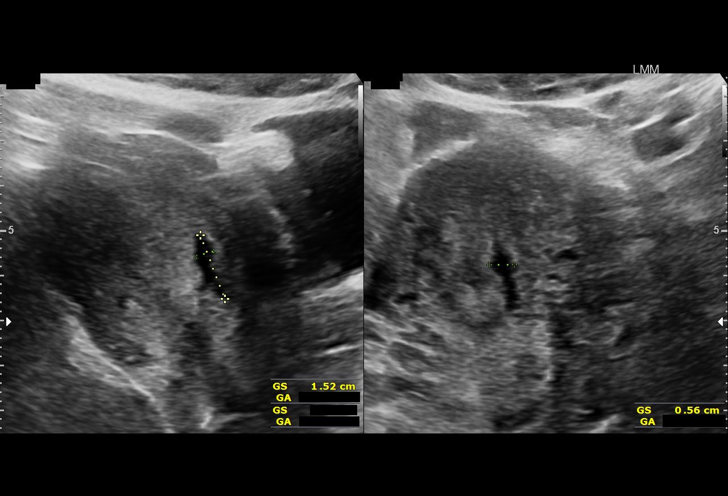
[im 15/39]
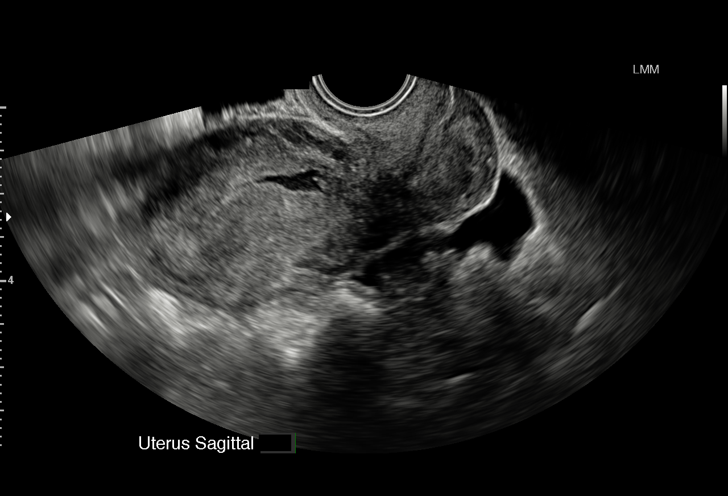
[im 17/39]
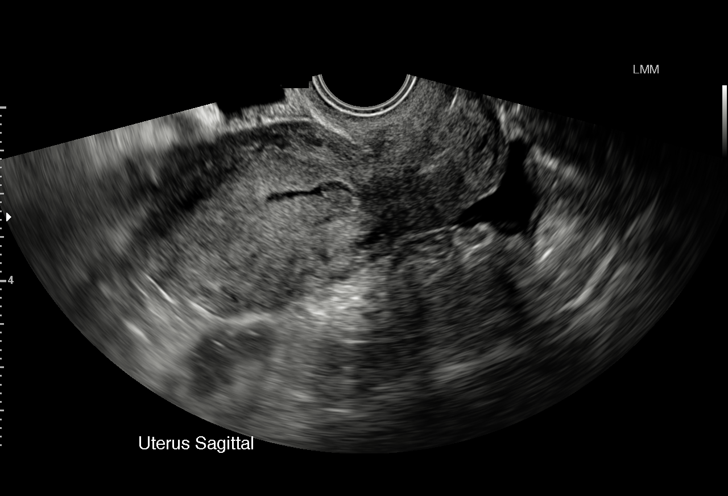
[im 20/39]
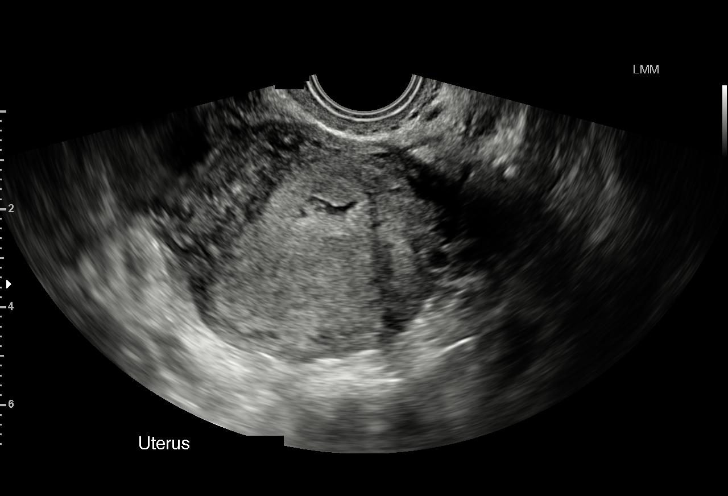
[im 22/39]
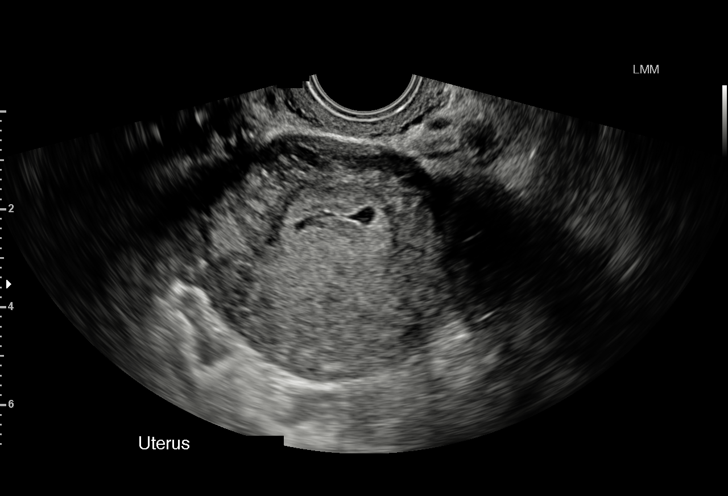
[im 24/39]
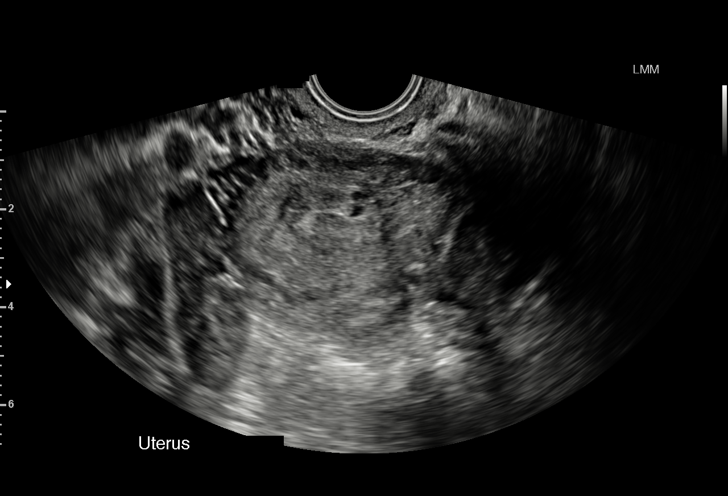
[im 27/39]
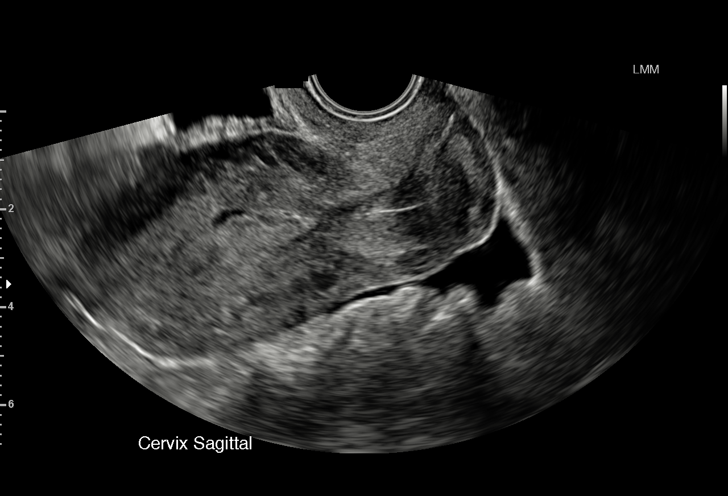
[im 30/39]
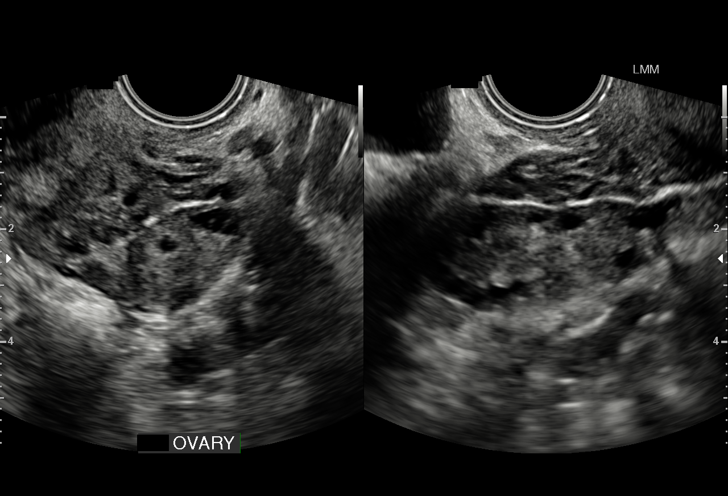
[im 33/39]
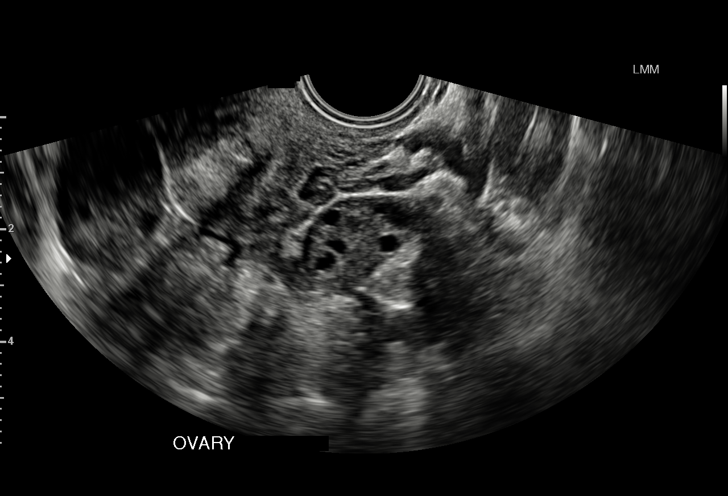
[im 36/39]
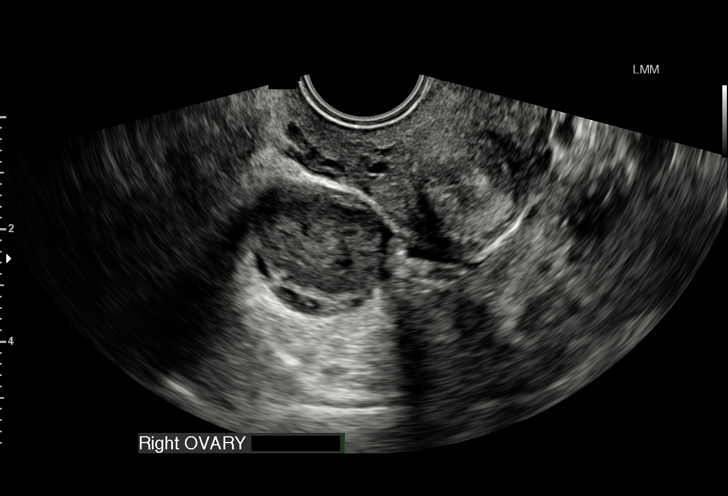
[im 39/39]
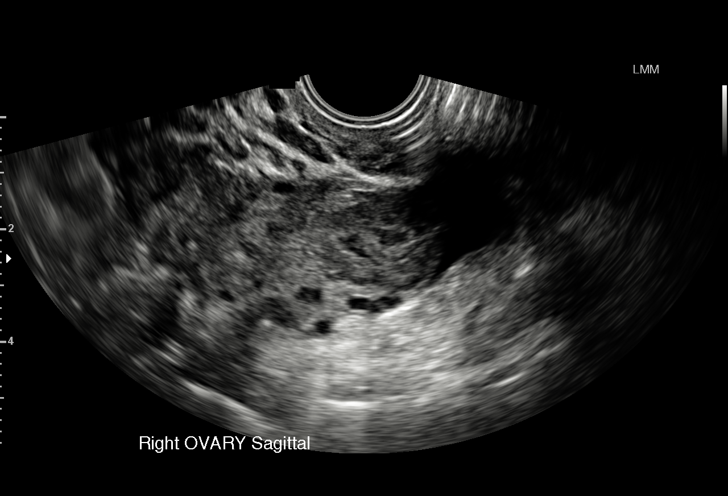

[15 of 28 positions shown; findings below may reference images not displayed]

FINDINGS: Intrauterine gestational sac: Not present

Yolk sac:  Not present

Embryo:  Not present

Cardiac Activity: Not present

Subchorionic hemorrhage:  None visualized.

Maternal uterus/adnexae: Normal appearance of the uterus. Small
amount of free fluid in the endometrium. PICC Normal appearance the
adnexae. Trace free fluid.
IMPRESSION: Small amount of free fluid in the endometrium. Pregnancy may not be
sonographically evident below beta HCG 1,000 ; pregnancy of unknown
location. Recommend follow-up beta HCG and, pelvic ultrasound in
10-14 days, as clinically indicated.

## 2017-08-02 IMAGING — US US MFM FETAL NUCHAL TRANSLUCENCY
2 series · 15 of 20 positions shown · non-contrast
Comparison: none

[Series 1: us mfm fetal nuchal translucency · 10 of 14 slices shown (1 of 2)]
[im 1/14]
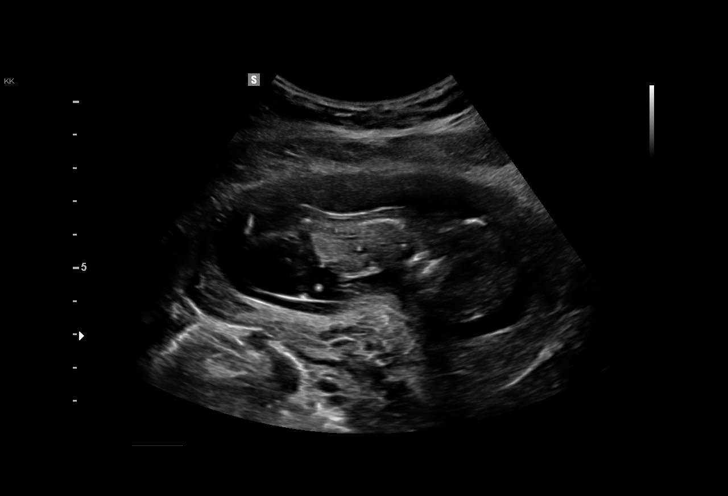
[im 3/14]
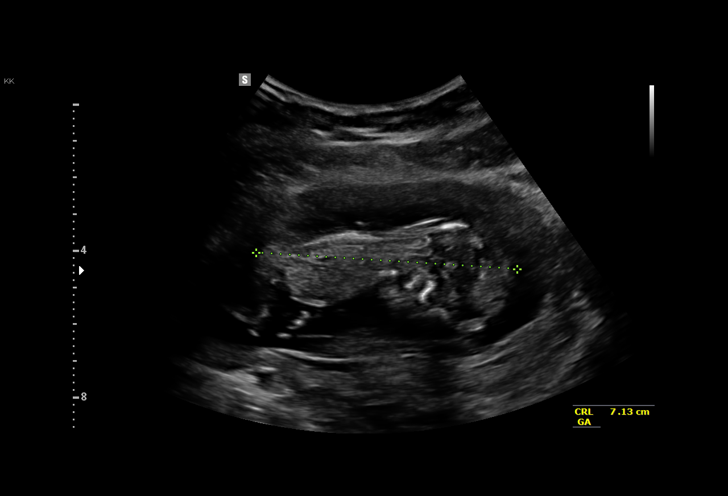
[im 4/14]
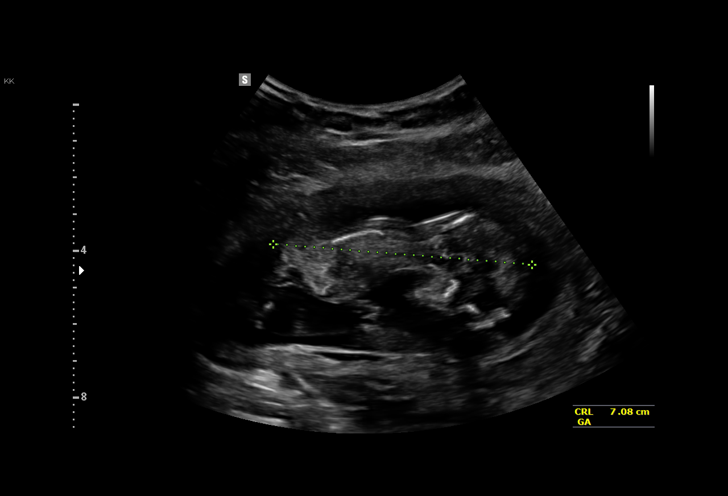
[im 5/14]
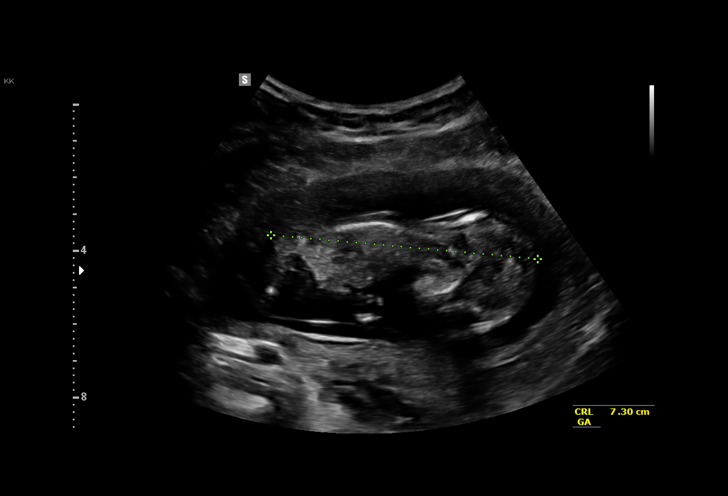
[im 7/14]
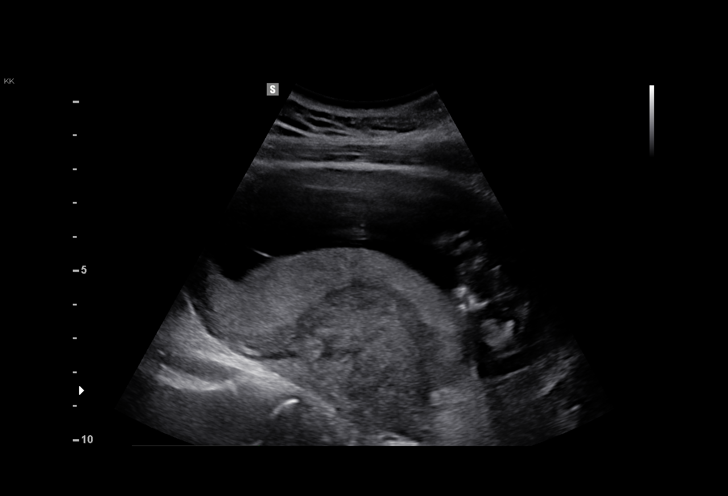
[im 8/14]
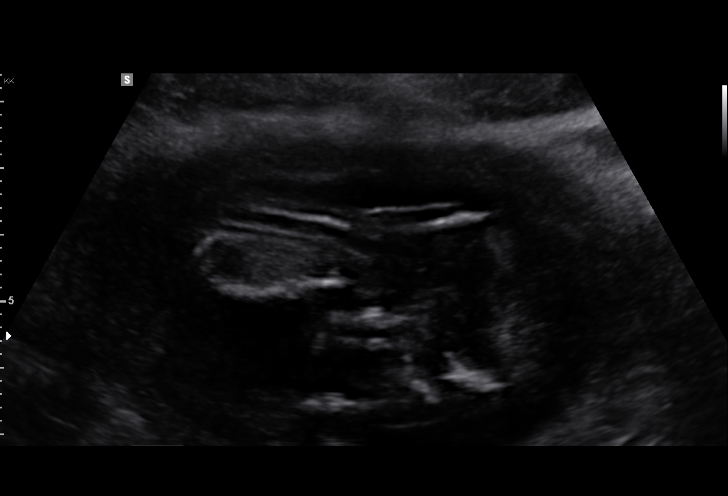
[im 9/14]
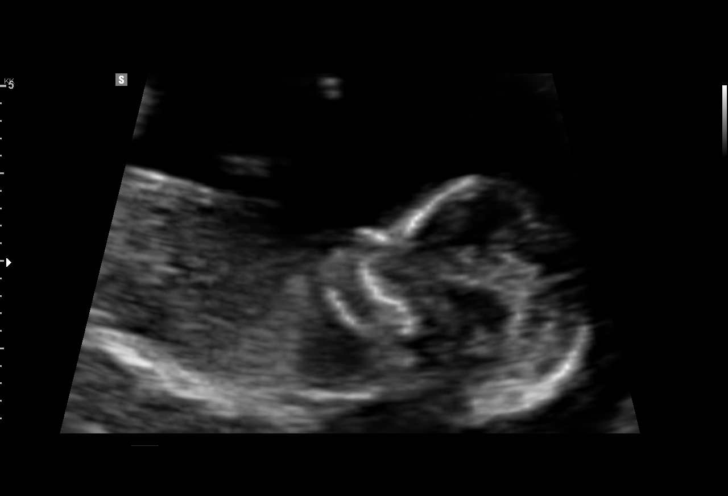
[im 11/14]
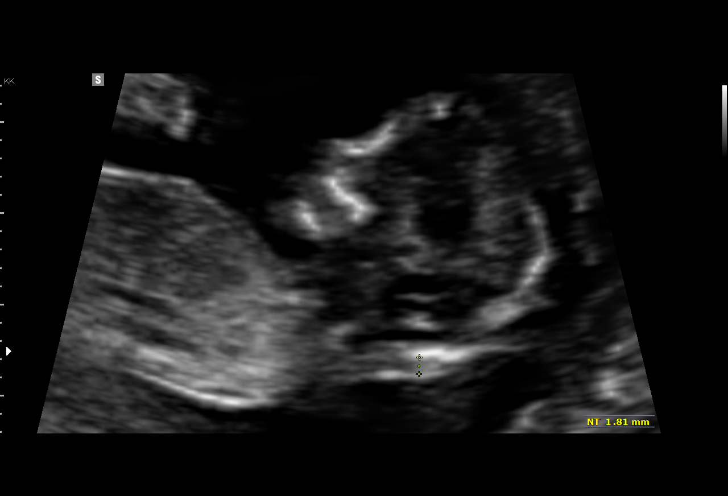
[im 12/14]
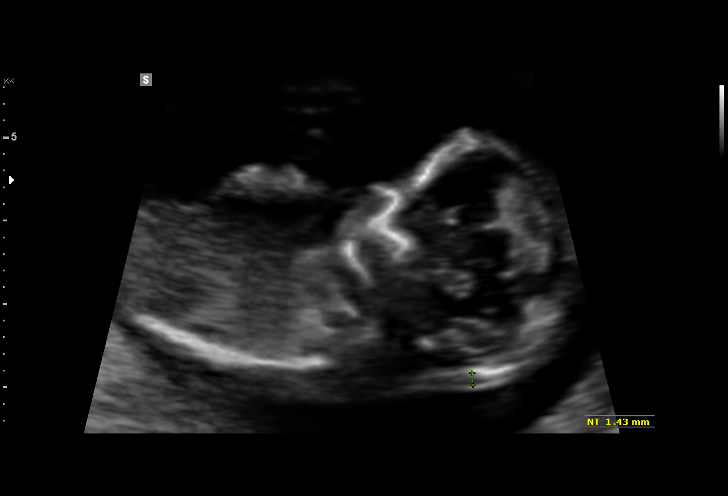
[im 13/14]
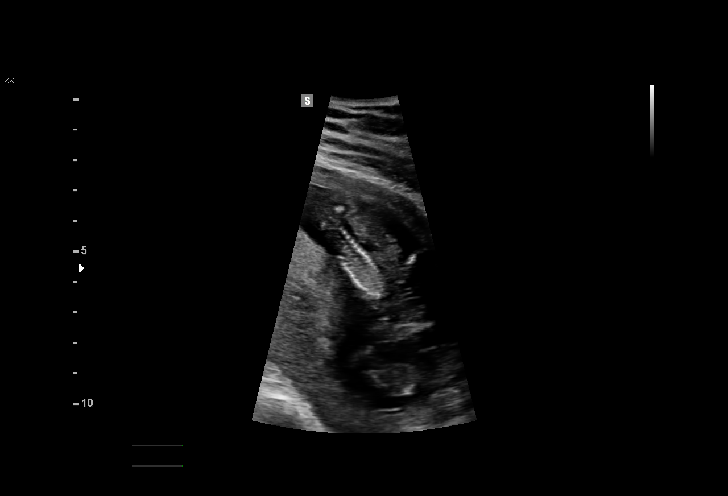

[Series 3: us mfm fetal nuchal translucency · 6 acquisitions, 5 frames shown (2 of 2)]
[im 1/6]
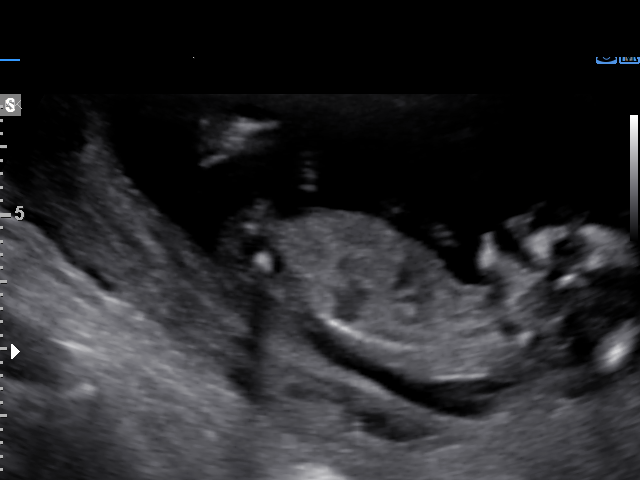
[im 2/6]
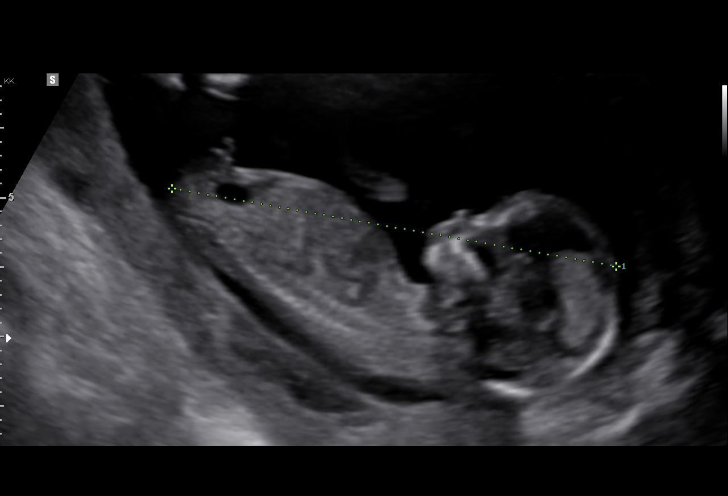
[im 3/6]
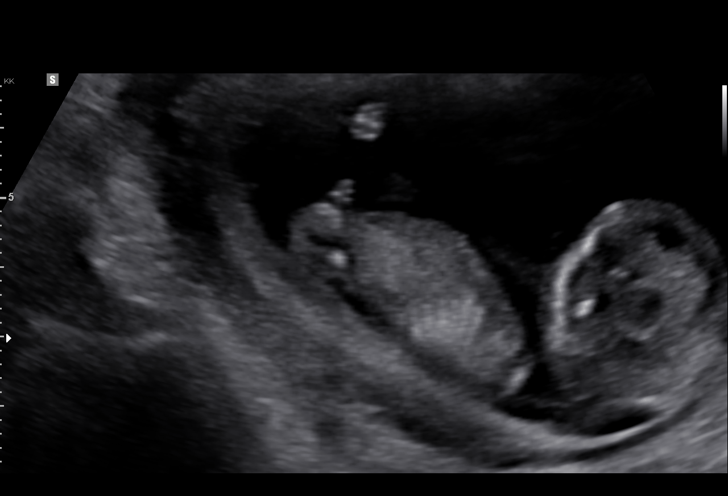
[im 5/6]
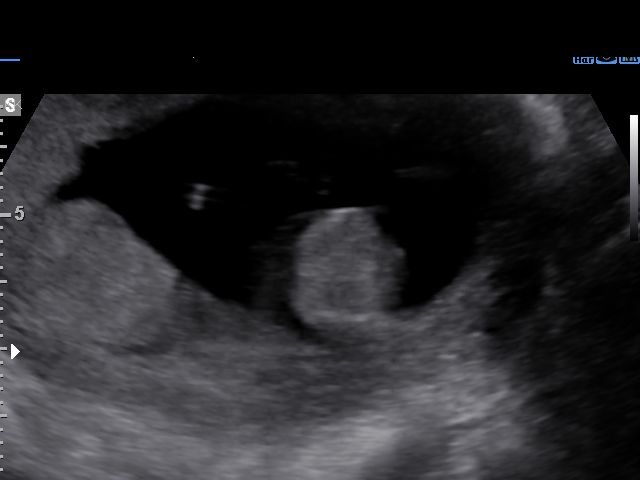
[im 6/6]
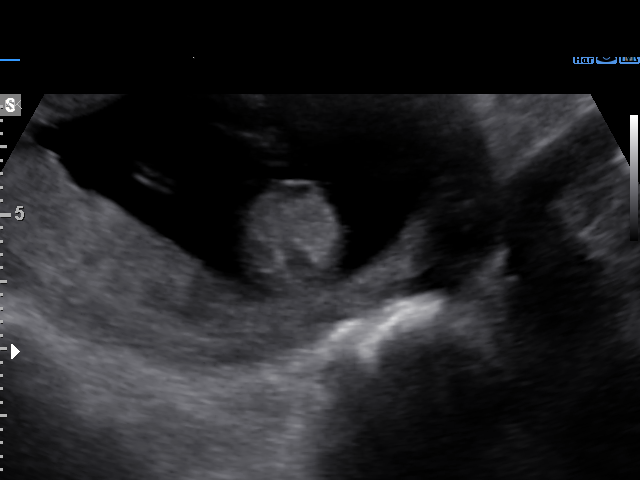

[15 of 20 positions shown; findings below may reference images not displayed]

Road [HOSPITAL]

TRANSLUCENCY

Indications

First trimester aneuploidy screen (NT)         Z36
12 weeks gestation of pregnancy
OB History

Height:       5'2"    Weight:   174       BMI:
Gravidity:    3         Term:   1        Prem:   0        SAB:   0
TOP:          1       Ectopic:  0        Living: 1
Fetal Evaluation

Num Of Fetuses:     1
Fetal Heart         148
Rate(bpm):
Cardiac Activity:   Observed
Presentation:       Cephalic

Amniotic Fluid
AFI FV:      Subjectively within normal limits
Biometry

CRL:      71.7  mm     G. Age:  13w 1d                  EDD:   08/12/16
Gestational Age

LMP:           13w 5d       Date:   11/02/15                 EDD:   08/08/16
Best:          12w 4d    Det. By:   Early Ultrasound         EDD:   08/16/16
(12/22/15)
Cervix Uterus Adnexa

Cervix
Normal appearance by transabdominal scan.
Impression

SIUP at 12+4 weeks
No gross abnormalities identified
NT measurement was within normal limits for this GA; NB
present
Normal amniotic fluid volume
Measurements consistent with LMP dating

After counseling, Ms. Ledesma declined first trimester
screening.
Recommendations

Offer MSAFP in the second trimester for ONTD screening
Offer anatomy U/S by 18 weeks

## 2017-08-24 ENCOUNTER — Encounter (HOSPITAL_COMMUNITY): Payer: Self-pay | Admitting: Emergency Medicine

## 2017-08-24 DIAGNOSIS — J029 Acute pharyngitis, unspecified: Secondary | ICD-10-CM | POA: Diagnosis present

## 2017-08-24 DIAGNOSIS — Z79899 Other long term (current) drug therapy: Secondary | ICD-10-CM | POA: Insufficient documentation

## 2017-08-24 DIAGNOSIS — F17210 Nicotine dependence, cigarettes, uncomplicated: Secondary | ICD-10-CM | POA: Diagnosis not present

## 2017-08-24 NOTE — ED Triage Notes (Signed)
Pt c/o sore throat and difficulty swallowing. Also c/o otalgia, and nasal congestion.

## 2017-08-25 ENCOUNTER — Emergency Department (HOSPITAL_COMMUNITY)
Admission: EM | Admit: 2017-08-25 | Discharge: 2017-08-25 | Disposition: A | Discharge status: Home / Self Care | Payer: BLUE CROSS/BLUE SHIELD | Attending: Emergency Medicine | Admitting: Emergency Medicine

## 2017-08-25 DIAGNOSIS — J029 Acute pharyngitis, unspecified: Secondary | ICD-10-CM

## 2017-08-25 LAB — RAPID STREP SCREEN (MED CTR MEBANE ONLY): Streptococcus, Group A Screen (Direct): NEGATIVE

## 2017-08-25 MED ORDER — DEXAMETHASONE 4 MG PO TABS
10.0000 mg | ORAL_TABLET | Freq: Once | ORAL | Status: AC
  Administered 2017-08-25: 10 mg via ORAL
  Filled 2017-08-25: qty 3

## 2017-08-25 MED ORDER — AZITHROMYCIN 250 MG PO TABS
500.0000 mg | ORAL_TABLET | Freq: Once | ORAL | Status: AC
  Administered 2017-08-25: 500 mg via ORAL
  Filled 2017-08-25: qty 2

## 2017-08-25 MED ORDER — AZITHROMYCIN 250 MG PO TABS: 250.0000 mg | ORAL_TABLET | Freq: Every day | ORAL | 0 refills | Status: AC

## 2017-08-25 NOTE — ED Provider Notes (Signed)
[facility]   CSN: [id number] Arrival date & time: 08/24/17  2323     History   Chief Complaint Chief Complaint  Patient presents with  . Sore Throat    HPI Mack HookBreanna Strickland is a 26 y.o. female.  The patient is here for evaluation of sore throat pain. No fever. She reports one week history of URI symptoms of congestion, dry cough, losing her voice that has gotten progressively better. She now has only a sore throat and painful swallowing. She is finding it hard to eat and is trying to maintain good fluid intake. No nausea or vomiting.    The history is provided by the patient. No language interpreter was used.  Sore Throat  Pertinent negatives include no chest pain, no abdominal pain and no headaches.    Past Medical History:  Diagnosis Date  . Pregnancy induced hypertension     Patient Active Problem List   Diagnosis Date Noted  . Supervision of other normal pregnancy, antepartum 01/16/2016  . H/O gastric ulcer 10/14/2014  . Smoker 10/10/2014    Past Surgical History:  Procedure Laterality Date  . ABCESS DRAINAGE     leg  . ESOPHAGOGASTRODUODENOSCOPY ENDOSCOPY      OB History    Gravida Para Term Preterm AB Living   3 1 1  0 1 1   SAB TAB Ectopic Multiple Live Births   0 1 0 0 1       Home Medications    Prior to Admission medications   Medication Sig Start Date End Date Taking? Authorizing Provider  albuterol (PROVENTIL HFA;VENTOLIN HFA) 108 (90 Base) MCG/ACT inhaler Inhale 1-2 puffs into the lungs every 6 (six) hours as needed for wheezing or shortness of breath.    [provider]  cyclobenzaprine (FLEXERIL) 10 MG tablet Take 1 tablet (10 mg total) by mouth at bedtime. Patient not taking: Reported on 07/29/2016 06/10/16   Quenten RavenBrein, Tricia R, MD  Prenatal Vit-Fe Fumarate-FA (PRENATAL MULTIVITAMIN) TABS tablet Take 1 tablet by mouth daily at 12 noon.    [provider]  ranitidine  (ZANTAC) 75 MG tablet Take 75 mg by mouth daily as needed for heartburn.    [provider]  terconazole (TERAZOL 7) 0.4 % vaginal cream Place 1 applicator vaginally at bedtime. Patient not taking: Reported on 07/29/2016 07/08/16   Hurshel PartyLeftwich-Kirby, Lisa A, CNM    Family History Family History  Problem Relation Age of Onset  . Hypertension Father   . Asthma Brother   . Diabetes Maternal Grandfather     Social History Social History  Substance Use Topics  . Smoking status: Current Every Day Smoker    Packs/day: 0.10    Types: Cigarettes  . Smokeless tobacco: Never Used  . Alcohol use No     Allergies   Patient has no known allergies.   Review of Systems Review of Systems  Constitutional: Negative for fever.  HENT: Positive for congestion, ear pain, sore throat and voice change.   Respiratory: Positive for cough.   Cardiovascular: Negative for chest pain.  Gastrointestinal: Negative for abdominal pain and nausea.  Musculoskeletal: Negative for myalgias.  Neurological: Negative for headaches.     Physical Exam Updated Vital Signs BP 118/70 (BP Location: Left Arm)   Pulse 70   Temp 99.6 F (37.6 C) (Oral)   Resp 20   Ht 5\' 1"  (1.549 m)   Wt 83.9 kg (185 lb)   LMP 08/22/2017  SpO2 99%   BMI 34.96 kg/m   Physical Exam  Constitutional: She is oriented to person, place, and time. She appears well-developed and well-nourished.  HENT:  Head: Normocephalic.  Right Ear: Tympanic membrane normal.  Left Ear: Tympanic membrane normal.  Mouth/Throat: Uvula is midline. Posterior oropharyngeal erythema present. Tonsils are 2+ on the right. Tonsils are 2+ on the left. No tonsillar exudate.  Neck: Normal range of motion. Neck supple.  Cardiovascular: Normal rate and regular rhythm.   Pulmonary/Chest: Effort normal and breath sounds normal. She has no wheezes. She has no rales.  Abdominal: Soft. Bowel sounds are normal. There is no tenderness. There is no rebound and  no guarding.  Musculoskeletal: Normal range of motion.  Lymphadenopathy:    She has cervical adenopathy.       Right cervical: Superficial cervical adenopathy present.       Left cervical: Superficial cervical adenopathy present.  Neurological: She is alert and oriented to person, place, and time.  Skin: Skin is warm and dry. No rash noted.  Psychiatric: She has a normal mood and affect.     ED Treatments / Results  Labs (all labs ordered are listed, but only abnormal results are displayed) Labs Reviewed  RAPID STREP SCREEN (NOT AT [facility])  CULTURE, GROUP A STREP [facility])   Results for orders placed or performed during the hospital encounter of 08/25/17  Rapid strep screen  Result Value Ref Range   Streptococcus, Group A Screen (Direct) NEGATIVE NEGATIVE    EKG  EKG Interpretation None       Radiology No results found.  Procedures Procedures (including critical care time)  Medications Ordered in ED Medications  azithromycin (ZITHROMAX) tablet 500 mg (not administered)  dexamethasone (DECADRON) tablet 10 mg (not administered)     Initial Impression / Assessment and Plan / ED Course  I have reviewed the triage vital signs and the nursing notes.  Pertinent labs & imaging results that were available during my care of the patient were reviewed by me and considered in my medical decision making (see chart for details).     Patient with URI symptoms x 1 week, now with isolated ST, difficult to swallow secondary to pain. No fever at any time. Strep negative.  Will cover with z-pack given duration of symptoms. Decadron provided in the ED for swelling.  Final Clinical Impressions(s) / ED Diagnoses   Final diagnoses:  None   1. Pharyngitis  New Prescriptions New Prescriptions   No medications on file     Elpidio Anis, Cordelia Poche 08/25/17 [id number]    Lavera Guise, MD 08/25/17 [phone]

## 2017-08-27 LAB — CULTURE, GROUP A STREP (THRC)
